# Patient Record
Sex: Female | Born: 1980 | Race: Black or African American | Hispanic: No | Marital: Married | Smoking: Never smoker
Health system: Southern US, Community
[De-identification: ages and names within clinical notes are randomized; demographics above are authoritative.]

---

## 2017-06-05 ENCOUNTER — Ambulatory Visit
Admission: RE | Admit: 2017-06-05 | Discharge: 2017-06-05 | Disposition: A | Discharge status: Home / Self Care | Payer: No Typology Code available for payment source | Source: Ambulatory Visit | Attending: Internal Medicine | Admitting: Internal Medicine

## 2017-06-05 ENCOUNTER — Other Ambulatory Visit: Payer: Self-pay | Admitting: Internal Medicine

## 2017-06-05 DIAGNOSIS — Z111 Encounter for screening for respiratory tuberculosis: Secondary | ICD-10-CM

## 2017-08-07 DIAGNOSIS — E786 Lipoprotein deficiency: Secondary | ICD-10-CM | POA: Insufficient documentation

## 2017-08-07 DIAGNOSIS — R718 Other abnormality of red blood cells: Secondary | ICD-10-CM | POA: Insufficient documentation

## 2017-08-07 DIAGNOSIS — D500 Iron deficiency anemia secondary to blood loss (chronic): Secondary | ICD-10-CM

## 2017-08-07 DIAGNOSIS — N920 Excessive and frequent menstruation with regular cycle: Secondary | ICD-10-CM | POA: Insufficient documentation

## 2017-08-07 DIAGNOSIS — R7303 Prediabetes: Secondary | ICD-10-CM | POA: Insufficient documentation

## 2017-08-07 HISTORY — DX: Other abnormality of red blood cells: R71.8

## 2017-08-07 HISTORY — DX: Iron deficiency anemia secondary to blood loss (chronic): D50.0

## 2018-02-27 ENCOUNTER — Ambulatory Visit: Payer: Self-pay | Admitting: Obstetrics

## 2018-02-28 ENCOUNTER — Encounter: Payer: Self-pay | Admitting: Obstetrics

## 2018-02-28 ENCOUNTER — Ambulatory Visit (INDEPENDENT_AMBULATORY_CARE_PROVIDER_SITE_OTHER): Payer: PRIVATE HEALTH INSURANCE | Admitting: Obstetrics

## 2018-02-28 VITALS — BP 115/81 | HR 89 | Ht 66.0 in | Wt 204.5 lb

## 2018-02-28 DIAGNOSIS — Z3202 Encounter for pregnancy test, result negative: Secondary | ICD-10-CM

## 2018-02-28 LAB — POCT URINE PREGNANCY: PREG TEST UR: NEGATIVE

## 2018-02-28 NOTE — Progress Notes (Signed)
Pt is here for initial visit. States that her periods are lasting longer than they used to.

## 2018-02-28 NOTE — Progress Notes (Signed)
Patient currently menstruating too heavy for pap smear.  She elected to reschedule her appointment.

## 2018-04-07 ENCOUNTER — Ambulatory Visit (INDEPENDENT_AMBULATORY_CARE_PROVIDER_SITE_OTHER): Payer: 59 | Admitting: Obstetrics and Gynecology

## 2018-04-07 ENCOUNTER — Encounter: Payer: Self-pay | Admitting: Obstetrics and Gynecology

## 2018-04-07 VITALS — BP 112/77 | HR 73 | Wt 204.2 lb

## 2018-04-07 DIAGNOSIS — Z01419 Encounter for gynecological examination (general) (routine) without abnormal findings: Secondary | ICD-10-CM | POA: Diagnosis not present

## 2018-04-07 DIAGNOSIS — Z1151 Encounter for screening for human papillomavirus (HPV): Secondary | ICD-10-CM | POA: Diagnosis not present

## 2018-04-07 DIAGNOSIS — Z113 Encounter for screening for infections with a predominantly sexual mode of transmission: Secondary | ICD-10-CM | POA: Diagnosis not present

## 2018-04-07 DIAGNOSIS — Z124 Encounter for screening for malignant neoplasm of cervix: Secondary | ICD-10-CM

## 2018-04-07 DIAGNOSIS — N898 Other specified noninflammatory disorders of vagina: Secondary | ICD-10-CM | POA: Diagnosis not present

## 2018-04-07 NOTE — Progress Notes (Signed)
Pt is here for pap smear/annual today.

## 2018-04-07 NOTE — Progress Notes (Signed)
Subjective:     Carene Gent is a 38 y.o. female P3 with BMI 32 abd LMP 03/22/2018 who is here for a comprehensive physical exam. The patient reports no problems. She is sexually active without contraception and is hoping for another child. Patient is without complaints. She denies pelvic pain or abnormal discharge. Patient desires STI screening  History reviewed. No pertinent past medical history. History reviewed. No pertinent surgical history. Family History  Problem Relation Age of Onset  . Diabetes Father     Social History   Socioeconomic History  . Marital status: Married    Spouse name: Not on file  . Number of children: Not on file  . Years of education: Not on file  . Highest education level: Not on file  Occupational History  . Not on file  Social Needs  . Financial resource strain: Not on file  . Food insecurity:    Worry: Not on file    Inability: Not on file  . Transportation needs:    Medical: Not on file    Non-medical: Not on file  Tobacco Use  . Smoking status: Not on file  Substance and Sexual Activity  . Alcohol use: Not on file  . Drug use: Not on file  . Sexual activity: Yes  Lifestyle  . Physical activity:    Days per week: Not on file    Minutes per session: Not on file  . Stress: Not on file  Relationships  . Social connections:    Talks on phone: Not on file    Gets together: Not on file    Attends religious service: Not on file    Active member of club or organization: Not on file    Attends meetings of clubs or organizations: Not on file    Relationship status: Not on file  . Intimate partner violence:    Fear of current or ex partner: Not on file    Emotionally abused: Not on file    Physically abused: Not on file    Forced sexual activity: Not on file  Other Topics Concern  . Not on file  Social History Narrative  . Not on file   Health Maintenance  Topic Date Due  . HIV Screening  11/20/1993  . TETANUS/TDAP   11/20/1997  . PAP SMEAR-Modifier  11/21/1999  . INFLUENZA VACCINE  10/17/2017       Review of Systems Pertinent items are noted in HPI.   Objective:  Blood pressure 112/77, pulse 73, weight 204 lb 3.2 oz (92.6 kg), last menstrual period 03/22/2018.     GENERAL: Well-developed, well-nourished female in no acute distress.  HEENT: Normocephalic, atraumatic. Sclerae anicteric.  NECK: Supple. Normal thyroid.  LUNGS: Clear to auscultation bilaterally.  HEART: Regular rate and rhythm. BREASTS: Symmetric in size. No palpable masses or lymphadenopathy, skin changes, or nipple drainage. ABDOMEN: Soft, nontender, nondistended. No organomegaly. PELVIC: Normal external female genitalia. Vagina is pink and rugated.  Normal discharge. Normal appearing cervix. Uterus is normal in size. No adnexal mass or tenderness. EXTREMITIES: No cyanosis, clubbing, or edema, 2+ distal pulses.    Assessment:    Healthy female exam.      Plan:    pap smear collected Patient desires STI screening Screening mammogram at the age of 33 Health maintenance labs ordered See After Visit Summary for Counseling Recommendations

## 2018-04-08 LAB — COMPREHENSIVE METABOLIC PANEL
A/G RATIO: 1.3 (ref 1.2–2.2)
ALK PHOS: 91 IU/L (ref 39–117)
ALT: 13 IU/L (ref 0–32)
AST: 11 IU/L (ref 0–40)
Albumin: 4.2 g/dL (ref 3.8–4.8)
BUN/Creatinine Ratio: 14 (ref 9–23)
BUN: 10 mg/dL (ref 6–20)
Bilirubin Total: 0.3 mg/dL (ref 0.0–1.2)
CO2: 21 mmol/L (ref 20–29)
Calcium: 9.2 mg/dL (ref 8.7–10.2)
Chloride: 100 mmol/L (ref 96–106)
Creatinine, Ser: 0.71 mg/dL (ref 0.57–1.00)
GFR calc Af Amer: 124 mL/min/{1.73_m2} (ref >59)
GFR calc non Af Amer: 108 mL/min/{1.73_m2} (ref >59)
Globulin, Total: 3.3 g/dL (ref 1.5–4.5)
Glucose: 87 mg/dL (ref 65–99)
POTASSIUM: 4.5 mmol/L (ref 3.5–5.2)
Sodium: 138 mmol/L (ref 134–144)
Total Protein: 7.5 g/dL (ref 6.0–8.5)

## 2018-04-08 LAB — CBC
Hematocrit: 38.4 % (ref 34.0–46.6)
Hemoglobin: 12.2 g/dL (ref 11.1–15.9)
MCH: 24.8 pg — ABNORMAL LOW (ref 26.6–33.0)
MCHC: 31.8 g/dL (ref 31.5–35.7)
MCV: 78 fL — AB (ref 79–97)
Platelets: 280 10*3/uL (ref 150–450)
RBC: 4.92 x10E6/uL (ref 3.77–5.28)
RDW: 14.3 % (ref 11.7–15.4)
WBC: 5.3 10*3/uL (ref 3.4–10.8)

## 2018-04-08 LAB — LIPID PANEL
Chol/HDL Ratio: 4.4 ratio (ref 0.0–4.4)
Cholesterol, Total: 173 mg/dL (ref 100–199)
HDL: 39 mg/dL — ABNORMAL LOW (ref >39)
LDL Calculated: 123 mg/dL — ABNORMAL HIGH (ref 0–99)
Triglycerides: 54 mg/dL (ref 0–149)
VLDL Cholesterol Cal: 11 mg/dL (ref 5–40)

## 2018-04-08 LAB — CERVICOVAGINAL ANCILLARY ONLY
Bacterial vaginitis: NEGATIVE
CHLAMYDIA, DNA PROBE: NEGATIVE
Candida vaginitis: NEGATIVE
NEISSERIA GONORRHEA: NEGATIVE
Trichomonas: POSITIVE — AB

## 2018-04-08 LAB — HIV ANTIBODY (ROUTINE TESTING W REFLEX): HIV Screen 4th Generation wRfx: NONREACTIVE

## 2018-04-08 LAB — HEMOGLOBIN A1C
Est. average glucose Bld gHb Est-mCnc: 120 mg/dL
HEMOGLOBIN A1C: 5.8 % — AB (ref 4.8–5.6)

## 2018-04-08 LAB — HEPATITIS B SURFACE ANTIGEN: HEP B S AG: NEGATIVE

## 2018-04-08 LAB — TSH: TSH: 1.42 u[IU]/mL (ref 0.450–4.500)

## 2018-04-08 LAB — RPR: RPR Ser Ql: NONREACTIVE

## 2018-04-08 LAB — HEPATITIS C ANTIBODY: Hep C Virus Ab: <0.1 s/co ratio (ref 0.0–0.9)

## 2018-04-09 MED ORDER — METRONIDAZOLE 500 MG PO TABS: 500.0000 mg | ORAL_TABLET | Freq: Two times a day (BID) | ORAL | 0 refills | Status: DC

## 2018-04-09 NOTE — Addendum Note (Signed)
Addended by: Catalina Antigua on: 04/09/2018 03:06 PM   Modules accepted: Orders

## 2018-04-10 LAB — CYTOLOGY - PAP
DIAGNOSIS: NEGATIVE
HPV (WINDOPATH): NOT DETECTED

## 2018-04-16 ENCOUNTER — Telehealth: Payer: Self-pay

## 2018-04-16 NOTE — Telephone Encounter (Signed)
Attempted to contact about results, no answer, left vm 

## 2018-04-17 ENCOUNTER — Telehealth: Payer: Self-pay

## 2018-04-17 NOTE — Telephone Encounter (Signed)
S/w pt via french interpreter, advised of results and rx sent.

## 2018-04-17 NOTE — Telephone Encounter (Signed)
Contacted pt with french interpreter for results, left vm

## 2020-06-30 ENCOUNTER — Emergency Department (HOSPITAL_COMMUNITY)
Admission: EM | Admit: 2020-06-30 | Discharge: 2020-07-01 | Disposition: A | Discharge status: Home / Self Care | Payer: BC Managed Care – PPO | Attending: Emergency Medicine | Admitting: Emergency Medicine

## 2020-06-30 ENCOUNTER — Emergency Department (HOSPITAL_COMMUNITY): Payer: BC Managed Care – PPO

## 2020-06-30 ENCOUNTER — Encounter (HOSPITAL_COMMUNITY): Payer: Self-pay | Admitting: Emergency Medicine

## 2020-06-30 ENCOUNTER — Other Ambulatory Visit: Payer: Self-pay

## 2020-06-30 DIAGNOSIS — R042 Hemoptysis: Secondary | ICD-10-CM | POA: Insufficient documentation

## 2020-06-30 NOTE — ED Triage Notes (Signed)
Emergency Medicine Provider Triage Evaluation Note  Amber Alvarado , a 40 y.o. female  was evaluated in triage.  Pt complains of blood-tinged sputum x3 over the last week.  Review of Systems  Positive: As above Negative: Pain, shortness of breath, fevers, chills, cough, nasal congestion, recent travel, leg swelling  Physical Exam  BP (!) 143/101 (BP Location: Left Arm)   Pulse 89   Temp 99.7 F (37.6 C) (Oral)   Resp 16   SpO2 100%  Gen:   Awake, no distress   HEENT:  Atraumatic  Resp:  Normal effort  Cardiac:  Normal rate  Abd:   Nondistended, nontender  MSK:   Moves extremities without difficulty  Neuro:  Speech clear   Medical Decision Making  Medically screening exam initiated at 6:26 PM.  Appropriate orders placed.  Amber Alvarado was informed that the remainder of the evaluation will be completed by another provider, this initial triage assessment does not replace that evaluation, and the importance of remaining in the ED until their evaluation is complete.  Clinical Impression  Patient with 3 separate episodes of blood-tinged sputum.  No crusting, no infectious symptoms.  No chest pain, shortness of breath.  Plan for chest x-ray.  Patient stable for further evaluation   Mare Ferrari, PA-C 06/30/20 1847

## 2020-06-30 NOTE — ED Triage Notes (Signed)
Pt reports "spitting up" mucous with blood in it x 3. Denies cough, vomiting, chest pain, shortness of breath. States it always clears up.

## 2020-06-30 NOTE — ED Provider Notes (Signed)
[facility] EMERGENCY DEPARTMENT Provider Note   CSN: [id number] Arrival date & time: 06/30/20  1820     History Chief Complaint  Patient presents with  . blood in mucous    Amber Alvarado is a 40 y.o. female.  Level 5 caveat for language barrier.  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  Patient here stating she has "blood in my saliva and mucus".  Has noticed specks of blood in her saliva when she clears her throat x3 in the past 2 weeks.  Denies coughing up any blood.  Denies any chest pain or shortness of breath.  Denies any difficulty breathing or difficulty swallowing.  States this happens occasionally and usually resolves after a few seconds.  Occasionally she sees blood when she "brushes my teeth too hard".  Does not take any blood thinners.  Denies any productive cough.  Denies abdominal pain, nausea or vomiting.  Denies any black or bloody stools.  Denies any recent travel out of the country.  Denies any sick contacts.  Born in [place] but has been in [place] for more than 7 years. No dizziness or lightheadedness.  The history is provided by the patient.       History reviewed. No pertinent past medical history.  There are no problems to display for this patient.   History reviewed. No pertinent surgical history.   OB History    Gravida  3   Para  3   Term  3   Preterm      AB      Living  3     SAB      IAB      Ectopic      Multiple      Live Births  3           Family History  Problem Relation Age of Onset  . Diabetes Father        Home Medications Prior to Admission medications   Medication Sig Start Date End Date Taking? Authorizing Provider  metroNIDAZOLE (FLAGYL) 500 MG tablet Take 1 tablet (500 mg total) by mouth 2 (two) times daily. 04/09/18   Constant, Peggy, MD    Allergies    Patient has no known allergies.  Review of Systems   Review of Systems  Constitutional: Negative for activity change, appetite change and  fever.  HENT: Negative for congestion and mouth sores.   Eyes: Negative for visual disturbance.  Respiratory: Negative for cough, chest tightness and shortness of breath.   Gastrointestinal: Negative for abdominal pain, nausea and vomiting.  Genitourinary: Negative for dysuria and hematuria.  Musculoskeletal: Negative for arthralgias and myalgias.  Skin: Negative for rash.  Neurological: Negative for dizziness, weakness and headaches.   all other systems are negative except as noted in the HPI and PMH.   Physical Exam Updated Vital Signs BP (!) 131/96   Pulse 67   Temp 97.9 F (36.6 C)   Resp 17   LMP 06/06/2020   SpO2 100%   Physical Exam Vitals and nursing note reviewed.  Constitutional:      General: She is not in acute distress.    Appearance: Normal appearance. She is well-developed and normal weight. She is not ill-appearing.  HENT:     Head: Normocephalic and atraumatic.     Mouth/Throat:     Pharynx: No oropharyngeal exudate.     Comments: Tongue, teeth and gingiva appear normal without evidence of recent bleeding.  No blood in oropharynx Eyes:  Conjunctiva/sclera: Conjunctivae normal.     Pupils: Pupils are equal, round, and reactive to light.  Neck:     Comments: No meningismus. Cardiovascular:     Rate and Rhythm: Normal rate and regular rhythm.     Heart sounds: Normal heart sounds. No murmur heard.   Pulmonary:     Effort: Pulmonary effort is normal. No respiratory distress.     Breath sounds: Normal breath sounds.  Chest:     Chest wall: No tenderness.  Abdominal:     Palpations: Abdomen is soft.     Tenderness: There is no abdominal tenderness. There is no guarding or rebound.  Musculoskeletal:        General: No tenderness. Normal range of motion.     Cervical back: Normal range of motion and neck supple.  Skin:    General: Skin is warm.  Neurological:     Mental Status: She is alert and oriented to person, place, and time.     Cranial  Nerves: No cranial nerve deficit.     Motor: No abnormal muscle tone.     Coordination: Coordination normal.     Comments:  5/5 strength throughout. CN 2-12 intact.Equal grip strength.   Psychiatric:        Behavior: Behavior normal.     ED Results / Procedures / Treatments   Labs (all labs ordered are listed, but only abnormal results are displayed) Labs Reviewed  CBC WITH DIFFERENTIAL/PLATELET - Abnormal; Notable for the following components:      Result Value   Hemoglobin 10.5 (*)    HCT 32.7 (*)    MCV 73.6 (*)    MCH 23.6 (*)    RDW 15.7 (*)    All other components within normal limits  COMPREHENSIVE METABOLIC PANEL - Abnormal; Notable for the following components:   Calcium 8.8 (*)    Anion gap 3 (*)    All other components within normal limits  D-DIMER, QUANTITATIVE - Abnormal; Notable for the following components:   D-Dimer, Quant 0.89 (*)    All other components within normal limits  I-STAT BETA HCG BLOOD, ED ([facility], [facility], [facility] ONLY)  POC URINE PREG, ED    EKG None  Radiology DG Chest 2 View  Result Date: 06/30/2020 CLINICAL DATA:  Three-day history of bloody sputum/hemoptysis. EXAM: CHEST - 2 VIEW COMPARISON:  06/05/2017. FINDINGS: Cardiomediastinal silhouette unremarkable and unchanged. Lungs clear. Bronchovascular markings normal. Pulmonary vascularity normal. No visible pleural effusions. No pneumothorax. Stable slight thoracic dextroscoliosis. Visualized bony thorax otherwise unremarkable. IMPRESSION: No acute cardiopulmonary disease.  Stable examination. Electronically Signed   By: Hulan Saas M.D.   On: 06/30/2020 19:26   CT Angio Chest PE W and/or Wo Contrast  Result Date: 07/01/2020 CLINICAL DATA:  40 year old female with 3 day history of bloody sputum. EXAM: CT ANGIOGRAPHY CHEST WITH CONTRAST TECHNIQUE: Multidetector CT imaging of the chest was performed using the standard protocol during bolus administration of intravenous contrast. Multiplanar CT image  reconstructions and MIPs were obtained to evaluate the vascular anatomy. CONTRAST:  78mL OMNIPAQUE IOHEXOL 350 MG/ML SOLN COMPARISON:  Chest radiographs yesterday. FINDINGS: Cardiovascular: Good contrast bolus timing in the pulmonary arterial tree. No focal filling defect identified in the pulmonary arteries to suggest acute pulmonary embolism. Cardiac size within normal limits. No pericardial effusion. Normal visible aorta. No coronary artery calcified atherosclerosis is evident. Mediastinum/Nodes: Negative. No mediastinal or hilar lymphadenopathy. Lungs/Pleura: Somewhat low lung volumes. Major airways remain patent. There is left upper lobe bronchiectasis with  distal airway opacification and segmental upper lobe collapse (series 11, image 30). Superimposed mosaic attenuation elsewhere in the left lung likely due to additional small airway disease and gas trapping. No pleural effusion. No convincing bronchiectasis elsewhere. Negative right lung aside from mild atelectasis. Upper Abdomen: Negative visible liver, spleen, pancreas, adrenal glands, bowel and right kidney. Questionable exophytic left renal upper pole soft tissue mass versus intermediate density cyst on series 5, image 95. Visible portion is about 17 mm diameter. Musculoskeletal: Negative. Review of the MIP images confirms the above findings. IMPRESSION: 1. Negative for acute pulmonary embolus. 2. Positive for segmental Bronchiectasis in the medial left upper lobe with distal airway opacification and segmental lung collapse. Legrand Rams this as the etiology of bloody sputum. Additional lobar gas trapping. No pleural effusion or other pulmonary abnormality. 3. Indeterminate appearance of the visible left renal upper pole. Cannot exclude exophytic soft tissue mass, although hyperdense benign cyst is possible. This would best be characterized with a routine outpatient Abdomen MRI (renal mass protocol without and with contrast). Electronically Signed   By: Odessa Fleming  M.D.   On: 07/01/2020 05:11    Procedures Procedures   Medications Ordered in ED Medications - No data to display  ED Course  I have reviewed the triage vital signs and the nursing notes.  Pertinent labs & imaging results that were available during my care of the patient were reviewed by me and considered in my medical decision making (see chart for details).    MDM Rules/Calculators/A&P                          Patient here with intermittent spots of blood in her saliva and mucus.   Chest x-ray in triage shows no acute abnormalities. Screening labs will be obtained to evaluate for thrombocytopenia.  Hemoglobin slightly decreased at 10.5 from 12 2 years ago.  Platelet count is stable.  D-dimer is positive.  CT scan shows no pulmonary embolism but does show bronchiectasis in the left upper lobe with distal airway opacification.  Patient informed of left renal mass and need for MRI follow-up.  D/w Dr. Aldean Ast.  Patient is stable.  She can follow-up in the office.  He does agree with Levaquin 750 mg x 5 days.  Return to the ED if symptoms worsen. Follow up with PCP and pulmonary. Patient aware of need for MRI of renal finding.  No hypoxia or increased work of breathing. No witnessed hemoptysis in the ED.  Return precautions discussed.  Final Clinical Impression(s) / ED Diagnoses Final diagnoses:  Hemoptysis    Rx / DC Orders ED Discharge Orders    None       Brylen Wagar, Jeannett Senior, MD 07/01/20 [phone]

## 2020-07-01 ENCOUNTER — Emergency Department (HOSPITAL_COMMUNITY): Payer: BC Managed Care – PPO

## 2020-07-01 LAB — COMPREHENSIVE METABOLIC PANEL
ALT: 14 U/L (ref 0–44)
AST: 15 U/L (ref 15–41)
Albumin: 3.6 g/dL (ref 3.5–5.0)
Alkaline Phosphatase: 59 U/L (ref 38–126)
Anion gap: 3 — ABNORMAL LOW (ref 5–15)
BUN: 9 mg/dL (ref 6–20)
CO2: 27 mmol/L (ref 22–32)
Calcium: 8.8 mg/dL — ABNORMAL LOW (ref 8.9–10.3)
Chloride: 105 mmol/L (ref 98–111)
Creatinine, Ser: 0.7 mg/dL (ref 0.44–1.00)
GFR, Estimated: >60 mL/min (ref >60)
Glucose, Bld: 90 mg/dL (ref 70–99)
Potassium: 3.8 mmol/L (ref 3.5–5.1)
Sodium: 135 mmol/L (ref 135–145)
Total Bilirubin: 0.7 mg/dL (ref 0.3–1.2)
Total Protein: 7.7 g/dL (ref 6.5–8.1)

## 2020-07-01 LAB — CBC WITH DIFFERENTIAL/PLATELET
Abs Immature Granulocytes: 0.02 10*3/uL (ref 0.00–0.07)
Basophils Absolute: 0 10*3/uL (ref 0.0–0.1)
Basophils Relative: 1 %
Eosinophils Absolute: 0.1 10*3/uL (ref 0.0–0.5)
Eosinophils Relative: 2 %
HCT: 32.7 % — ABNORMAL LOW (ref 36.0–46.0)
Hemoglobin: 10.5 g/dL — ABNORMAL LOW (ref 12.0–15.0)
Immature Granulocytes: 0 %
Lymphocytes Relative: 42 %
Lymphs Abs: 2.7 10*3/uL (ref 0.7–4.0)
MCH: 23.6 pg — ABNORMAL LOW (ref 26.0–34.0)
MCHC: 32.1 g/dL (ref 30.0–36.0)
MCV: 73.6 fL — ABNORMAL LOW (ref 80.0–100.0)
Monocytes Absolute: 0.3 10*3/uL (ref 0.1–1.0)
Monocytes Relative: 5 %
Neutro Abs: 3.2 10*3/uL (ref 1.7–7.7)
Neutrophils Relative %: 50 %
Platelets: 232 10*3/uL (ref 150–400)
RBC: 4.44 MIL/uL (ref 3.87–5.11)
RDW: 15.7 % — ABNORMAL HIGH (ref 11.5–15.5)
WBC: 6.3 10*3/uL (ref 4.0–10.5)
nRBC: 0 % (ref 0.0–0.2)

## 2020-07-01 LAB — I-STAT BETA HCG BLOOD, ED (MC, WL, AP ONLY): I-stat hCG, quantitative: <5 m[IU]/mL (ref <5)

## 2020-07-01 LAB — D-DIMER, QUANTITATIVE: D-Dimer, Quant: 0.89 ug/mL-FEU — ABNORMAL HIGH (ref 0.00–0.50)

## 2020-07-01 MED ORDER — IOHEXOL 350 MG/ML SOLN
80.0000 mL | Freq: Once | INTRAVENOUS | Status: AC | PRN
  Administered 2020-07-01: 59 mL via INTRAVENOUS

## 2020-07-01 MED ORDER — LEVOFLOXACIN 750 MG PO TABS: 750.0000 mg | ORAL_TABLET | Freq: Every day | ORAL | 0 refills | Status: DC

## 2020-07-01 NOTE — Discharge Instructions (Addendum)
Take the antibiotics as prescribed and follow-up with the pulmonologist. You also need to have an MRI of your abdomen to further assess a possible mass in your kidney.  Return to the ED with difficulty breathing, chest pain, coughing up more blood or any other concerns

## 2020-07-26 ENCOUNTER — Ambulatory Visit: Payer: BC Managed Care – PPO | Admitting: Pulmonary Disease

## 2020-07-26 ENCOUNTER — Encounter: Payer: Self-pay | Admitting: Pulmonary Disease

## 2020-07-26 ENCOUNTER — Other Ambulatory Visit: Payer: Self-pay

## 2020-07-26 VITALS — BP 120/74 | HR 76 | Temp 98.0°F | Ht 66.0 in | Wt 199.8 lb

## 2020-07-26 DIAGNOSIS — J479 Bronchiectasis, uncomplicated: Secondary | ICD-10-CM | POA: Diagnosis not present

## 2020-07-26 NOTE — Patient Instructions (Addendum)
You have an abnormal airway in the left anterior part of your lung called bronchiectasis.   There is no other treatment needed at this time.   We will have you follow up in 6 months after you have a repeat CT chest scan done to monitor the abnormal airway

## 2020-07-26 NOTE — Progress Notes (Signed)
Synopsis: Referred in May 2022 for hemoptysis  Subjective:   PATIENT ID: Amber Alvarado GENDER: female DOB: [birth year], MRN: [id number]   HPI  Chief Complaint  Patient presents with   Hospitalization Follow-up    HFU. States she has been doing well since being home. Denies seeing any more blood since being home.    Amber Alvarado is a 40 year old woman, never smoker who is referred to pulmonary clinic for evaluation of hemoptysis.   She presented to the ER on 06/30/20 with blood in her sputum. She initially noted specks of blood over the previous week or so. She denied any issues with her breathing or chest pain. She denied any fever, chills or sweats. She had elevated d-dimer so CTA chest was done and negative for PE but did show bronchiectatic airway of the left upper lobe with distal impaction. She was treated with levaquin for 5 days and denies any further episodes of blood in her sputum. She denies any wheezing.   She denies any TB exposures in the past. She denies any history of respiratory issues or infections. She denies dysphagia or GERD symptoms. She has lost weight unintentional recently, about 7lbs.   No past medical history on file.   Family History  Problem Relation Age of Onset   Diabetes Father      Social History   Socioeconomic History   Marital status: Married    Spouse name: Not on file   Number of children: Not on file   Years of education: Not on file   Highest education level: Not on file  Occupational History   Not on file  Tobacco Use   Smoking status: Never   Smokeless tobacco: Never  Substance and Sexual Activity   Alcohol use: Not on file   Drug use: Not on file   Sexual activity: Yes  Other Topics Concern   Not on file  Social History Narrative   Not on file   Social Determinants of Health   Financial Resource Strain: Not on file  Food Insecurity: Not on file  Transportation Needs: Not on file  Physical Activity:  Not on file  Stress: Not on file  Social Connections: Not on file  Intimate Partner Violence: Not on file     No Known Allergies   Outpatient Medications Prior to Visit  Medication Sig Dispense Refill   levofloxacin (LEVAQUIN) 750 MG tablet Take 1 tablet (750 mg total) by mouth daily. 5 tablet 0   No facility-administered medications prior to visit.    Review of Systems  Constitutional:  Negative for chills, fever, malaise/fatigue and weight loss.  HENT:  Negative for congestion, sinus pain and sore throat.   Eyes: Negative.   Respiratory:  Negative for cough, hemoptysis, sputum production, shortness of breath and wheezing.   Cardiovascular:  Negative for chest pain, palpitations, orthopnea, claudication and leg swelling.  Gastrointestinal:  Negative for abdominal pain, heartburn, nausea and vomiting.  Genitourinary: Negative.   Musculoskeletal:  Negative for joint pain and myalgias.  Skin:  Negative for rash.  Neurological:  Negative for weakness.  Endo/Heme/Allergies: Negative.   Psychiatric/Behavioral: Negative.       Objective:   Vitals:   07/26/20 1532  BP: 120/74  Pulse: 76  Temp: 98 F (36.7 C)  TempSrc: Temporal  SpO2: 100%  Weight: 199 lb 12.8 oz (90.6 kg)  Height: 5\' 6"  (1.676 m)     Physical Exam Constitutional:      General: She  is not in acute distress.    Appearance: She is not ill-appearing.  HENT:     Head: Normocephalic and atraumatic.  Eyes:     General: No scleral icterus.    Conjunctiva/sclera: Conjunctivae normal.     Pupils: Pupils are equal, round, and reactive to light.  Cardiovascular:     Rate and Rhythm: Normal rate and regular rhythm.     Pulses: Normal pulses.     Heart sounds: Normal heart sounds. No murmur heard. Pulmonary:     Effort: Pulmonary effort is normal.     Breath sounds: Normal breath sounds. No wheezing, rhonchi or rales.  Abdominal:     General: Bowel sounds are normal.     Palpations: Abdomen is soft.   Musculoskeletal:     Right lower leg: No edema.     Left lower leg: No edema.  Lymphadenopathy:     Cervical: No cervical adenopathy.  Skin:    General: Skin is warm and dry.  Neurological:     General: No focal deficit present.     Mental Status: She is alert.  Psychiatric:        Mood and Affect: Mood normal.        Behavior: Behavior normal.        Thought Content: Thought content normal.        Judgment: Judgment normal.      CBC    Component Value Date/Time   WBC 6.3 07/01/2020 0133   RBC 4.44 07/01/2020 0133   HGB 10.5 (L) 07/01/2020 0133   HGB 12.2 04/07/2018 1117   HCT 32.7 (L) 07/01/2020 0133   HCT 38.4 04/07/2018 1117   PLT 232 07/01/2020 0133   PLT 280 04/07/2018 1117   MCV 73.6 (L) 07/01/2020 0133   MCV 78 (L) 04/07/2018 1117   MCH 23.6 (L) 07/01/2020 0133   MCHC 32.1 07/01/2020 0133   RDW 15.7 (H) 07/01/2020 0133   RDW 14.3 04/07/2018 1117   LYMPHSABS 2.7 07/01/2020 0133   MONOABS 0.3 07/01/2020 0133   EOSABS 0.1 07/01/2020 0133   BASOSABS 0.0 07/01/2020 0133   BMP Latest Ref Rng & Units 08/17/2020 07/01/2020 04/07/2018  Glucose 65 - 99 mg/dL 86 90 87  BUN 6 - 24 mg/dL 10 9 10   Creatinine 0.57 - 1.00 mg/dL 1.54 0.08  BUN/Creat Ratio 9 - 23 14 - 14  Sodium 134 - 144 mmol/L 139 135 138  Potassium 3.5 - 5.2 mmol/L 4.8 3.8 4.5  Chloride 96 - 106 mmol/L 105 105 100  CO2 20 - 29 mmol/L 23 27 21   Calcium 8.7 - 10.2 mg/dL 9.3 6.76) 9.2    Chest imaging: CTA Chest 07/01/20 No pulmonary emboli. Segmental bronchiectasis in the medial left upper lobe with distal airway opacification and segmental lung collapse. Mosaic attenuation elsewhere in left lung concerning for small airways disease.  PFT: No flowsheet data found.    Assessment & Plan:   Bronchiectasis without complication (HCC) - Plan: CT Chest Wo Contrast  Discussion: Amber Alvarado is a 40 year old woman, never smoker who is referred to pulmonary clinic for evaluation of  hemoptysis.   She has a bronchiectatic airway of the left upper lobe which was likely infected that led to inflammation and bleeding with the resultant hemoptysis. She clinically improved with levaquin treatment.   We will repeat a CT chest in 6 months to monitor this airway.   Lyndee Hensen, MD [facility] Office: [phone]  Current Outpatient Medications:    Iron, Ferrous Sulfate, 325 (65 Fe) MG TABS, Take 325 mg by mouth daily., Disp: 100 tablet, Rfl: 1

## 2020-08-17 ENCOUNTER — Ambulatory Visit: Payer: BC Managed Care – PPO | Attending: Physician Assistant | Admitting: Physician Assistant

## 2020-08-17 ENCOUNTER — Encounter: Payer: Self-pay | Admitting: Physician Assistant

## 2020-08-17 ENCOUNTER — Encounter (INDEPENDENT_AMBULATORY_CARE_PROVIDER_SITE_OTHER): Payer: Self-pay

## 2020-08-17 ENCOUNTER — Other Ambulatory Visit: Payer: Self-pay

## 2020-08-17 VITALS — BP 108/70 | HR 86 | Resp 16 | Ht 65.5 in | Wt 198.6 lb

## 2020-08-17 DIAGNOSIS — N2889 Other specified disorders of kidney and ureter: Secondary | ICD-10-CM | POA: Diagnosis not present

## 2020-08-17 DIAGNOSIS — Z09 Encounter for follow-up examination after completed treatment for conditions other than malignant neoplasm: Secondary | ICD-10-CM

## 2020-08-17 DIAGNOSIS — D500 Iron deficiency anemia secondary to blood loss (chronic): Secondary | ICD-10-CM

## 2020-08-17 DIAGNOSIS — Z789 Other specified health status: Secondary | ICD-10-CM | POA: Diagnosis not present

## 2020-08-17 MED ORDER — IRON (FERROUS SULFATE) 325 (65 FE) MG PO TABS: 325.0000 mg | ORAL_TABLET | Freq: Every day | ORAL | 1 refills | Status: DC

## 2020-08-17 NOTE — Progress Notes (Signed)
Patient ID: Amber Alvarado, female   DOB: Jan 14, 1981, 40 y.o.   MRN: [id number]     Amber Alvarado, is a 40 y.o. female  HKV:[id number]  FIE:[id number]  DOB - 1980-04-30  Subjective:  Chief Complaint and HPI: Amber Alvarado is a 40 y.o. female here today to establish care and for a follow up visit After ED visit 06/30/2020 for blood in her saliva.  This has resolved completely.  No SOB.  No cough.  No CP.    Incidentally, she was found to have a renal mass and needs follow up MRI.  No urinary issues.  No N/V/D.  Appetite is good.    H/o heavy periods and anemia.  Not on iron.  Hgb =10.5 at ED.    From ED note:  Patient here with intermittent spots of blood in her saliva and mucus.   Chest x-ray in triage shows no acute abnormalities. Screening labs will be obtained to evaluate for thrombocytopenia.  Hemoglobin slightly decreased at 10.5 from 12 2 years ago.  Platelet count is stable.  D-dimer is positive.  CT scan shows no pulmonary embolism but does show bronchiectasis in the left upper lobe with distal airway opacification.  Patient informed of left renal mass and need for MRI follow-up.  D/w Dr. Aldean Ast.  Patient is stable.  She can follow-up in the office.  He does agree with Levaquin 750 mg x 5 days.  Return to the ED if symptoms worsen. Follow up with PCP and pulmonary. Patient aware of need for MRI of renal finding.  No hypoxia or increased work of breathing. No witnessed hemoptysis in the ED.  Return precautions discussed.   CT chest IMPRESSION: 1. Negative for acute pulmonary embolus. 2. Positive for segmental Bronchiectasis in the medial left upper lobe with distal airway opacification and segmental lung collapse. Legrand Rams this as the etiology of bloody sputum. Additional lobar gas trapping. No pleural effusion or other pulmonary abnormality. 3. Indeterminate appearance of the visible left renal upper pole. Cannot exclude exophytic soft  tissue mass, although hyperdense benign cyst is possible. This would best be characterized with a routine outpatient Abdomen MRI (renal mass protocol without and with contrast).   ED/Hospital notes reviewed.   Social History:  From [place] but in [place] x 7 years  ROS:   Constitutional:  No f/c, No night sweats, No unexplained weight loss. EENT:  No vision changes, No blurry vision, No hearing changes. No mouth, throat, or ear problems.  Respiratory: No cough, No SOB Cardiac: No CP, no palpitations GI:  No abd pain, No N/V/D. GU: No Urinary s/sx Musculoskeletal: No joint pain Neuro: No headache, no dizziness, no motor weakness.  Skin: No rash Endocrine:  No polydipsia. No polyuria.  Psych: Denies SI/HI  Problem  Iron Deficiency Anemia Due to Chronic Blood Loss  Prediabetes    ALLERGIES: No Known Allergies  PAST MEDICAL HISTORY: No past medical history on file.  MEDICATIONS AT HOME: Prior to Admission medications   Medication Sig Start Date End Date Taking? Authorizing Provider  Iron, Ferrous Sulfate, 325 (65 Fe) MG TABS Take 325 mg by mouth daily. 08/17/20  Yes Anders Simmonds, PA-C     Objective:  EXAM:   Vitals:   08/17/20 1033  BP: 108/70  Pulse: 86  Resp: 16  SpO2: 99%  Weight: 198 lb 9.6 oz (90.1 kg)  Height: 5' 5.5" (1.664 m)    General appearance : A&OX3. NAD. Non-toxic-appearing HEENT: Atraumatic and Normocephalic.  PERRLA. EOM intact.  Neck: supple, no JVD. No cervical lymphadenopathy. No thyromegaly Chest/Lungs:  Breathing-non-labored, Good air entry bilaterally, breath sounds normal without rales, rhonchi, or wheezing  CVS: S1 S2 regular, no murmurs, gallops, rubs  Extremities: Bilateral Lower Ext shows no edema, both legs are warm to touch with = pulse throughout Neurology:  CN II-XII grossly intact, Non focal.   Psych:  TP linear. J/I WNL. Normal speech. Appropriate eye contact and affect.  Skin:  No Rash  Data Review Lab Results  Component  Value Date   HGBA1C 5.8 (H) 04/07/2018     Assessment & Plan   1. Iron deficiency anemia due to chronic blood loss Start iron sulfate 325 mg daily  2. Renal mass - MR Abdomen W Wo Contrast; Future - Basic metabolic panel  3. Other specified disorders of kidney and ureter - MR Abdomen W Wo Contrast; Future - Basic metabolic panel  4. Language barrier [facility] [place]) interpreters used and additional time performing visit was required.   5. Encounter for examination following treatment at hospital Cough and blood in sputum resolved.  Saw pulmonary 07/26/2020-incomplete note in Epic as of today's date  Doing well     Patient have been counseled extensively about nutrition and exercise  Return in about 3 months (around 11/17/2020) for assign PCP.  The patient was given clear instructions to go to ER or return to medical center if symptoms don't improve, worsen or new problems develop. The patient verbalized understanding. The patient was told to call to get lab results if they haven't heard anything in the next week.     Georgian Co, PA-C [facility] [place], [place] [phone]   08/17/2020, 11:11 AM

## 2020-08-18 LAB — BASIC METABOLIC PANEL
BUN/Creatinine Ratio: 14 (ref 9–23)
BUN: 10 mg/dL (ref 6–24)
CO2: 23 mmol/L (ref 20–29)
Calcium: 9.3 mg/dL (ref 8.7–10.2)
Chloride: 105 mmol/L (ref 96–106)
Creatinine, Ser: 0.71 mg/dL (ref 0.57–1.00)
Glucose: 86 mg/dL (ref 65–99)
Potassium: 4.8 mmol/L (ref 3.5–5.2)
Sodium: 139 mmol/L (ref 134–144)
eGFR: 109 mL/min/{1.73_m2} (ref >59)

## 2020-08-22 ENCOUNTER — Ambulatory Visit (HOSPITAL_BASED_OUTPATIENT_CLINIC_OR_DEPARTMENT_OTHER): Payer: BC Managed Care – PPO

## 2020-08-26 ENCOUNTER — Encounter: Payer: Self-pay | Admitting: Pulmonary Disease

## 2020-08-29 ENCOUNTER — Other Ambulatory Visit: Payer: Self-pay

## 2020-08-29 ENCOUNTER — Ambulatory Visit (HOSPITAL_COMMUNITY)
Admission: RE | Admit: 2020-08-29 | Discharge: 2020-08-29 | Disposition: A | Discharge status: Home / Self Care | Payer: BC Managed Care – PPO | Source: Ambulatory Visit | Attending: Physician Assistant | Admitting: Physician Assistant

## 2020-08-29 DIAGNOSIS — N2889 Other specified disorders of kidney and ureter: Secondary | ICD-10-CM | POA: Insufficient documentation

## 2020-08-29 MED ORDER — GADOBUTROL 1 MMOL/ML IV SOLN
9.0000 mL | Freq: Once | INTRAVENOUS | Status: AC | PRN
  Administered 2020-08-29: 9 mL via INTRAVENOUS

## 2020-08-31 ENCOUNTER — Other Ambulatory Visit: Payer: Self-pay | Admitting: Physician Assistant

## 2020-08-31 DIAGNOSIS — K8020 Calculus of gallbladder without cholecystitis without obstruction: Secondary | ICD-10-CM

## 2020-09-01 NOTE — Progress Notes (Signed)
Left message on voicemail per DPR. Encourage to call if they have additional questions or concerns.  Due to language barrier, an interpreter was used.  Interpreter name, Merian Capron Interpreter ID #[id number], Reason for encounter--lab results.

## 2020-11-21 NOTE — Progress Notes (Signed)
New Patient Office Visit  Subjective:  Patient ID: Amber Alvarado, female    DOB: [birth year]  Age: 40 y.o. MRN: [id number]  CC:  Chief Complaint  Patient presents with   Establish Care    HPI Amber Alvarado presents for establish primary care.  Patient was seen previously in June by PA [place].  Documentation is as below   Saw [name] 08/2020 Amber Alvarado is a 40 y.o. female here today to establish care and for a follow up visit After ED visit 06/30/2020 for blood in her saliva.  This has resolved completely.  No SOB.  No cough.  No CP.     Incidentally, she was found to have a renal mass and needs follow up MRI.  No urinary issues.  No N/V/D.  Appetite is good.     H/o heavy periods and anemia.  Not on iron.  Hgb =10.5 at ED.     From ED note:   Patient here with intermittent spots of blood in her saliva and mucus.    Chest x-ray in triage shows no acute abnormalities. Screening labs will be obtained to evaluate for thrombocytopenia.   Hemoglobin slightly decreased at 10.5 from 12 2 years ago.  Platelet count is stable.  D-dimer is positive.   CT scan shows no pulmonary embolism but does show bronchiectasis in the left upper lobe with distal airway opacification.  Patient informed of left renal mass and need for MRI follow-up.   D/w Dr. Aldean Ast.  Patient is stable.  She can follow-up in the office.  He does agree with Levaquin 750 mg x 5 days.  Return to the ED if symptoms worsen. Follow up with PCP and pulmonary. Patient aware of need for MRI of renal finding.  No hypoxia or increased work of breathing. No witnessed hemoptysis in the ED.  Return precautions discussed.     ED/Hospital notes reviewed.   Social History:  From [place] but in [place] x 7 years   1. Iron deficiency anemia due to chronic blood loss Start iron sulfate 325 mg daily   2. Renal mass - MR Abdomen W Wo Contrast; Future - Basic metabolic panel   3. Other specified  disorders of kidney and ureter - MR Abdomen W Wo Contrast; Future - Basic metabolic panel   4. Language barrier [facility] [place]) interpreters used and additional time performing visit was required.     5. Encounter for examination following treatment at hospital Cough and blood in sputum resolved.  Saw pulmonary 07/26/2020-incomplete note in Epic as of today's date  Note language barrier at today's visit is assisted by in person interpreter Joni Reining for [place] The patient's had no further hemoptysis since that visit.  She did see pulmonary and they detected no abnormalities she had finished her course of Levaquin and they recommended repeat CT scan of the chest in 6 months from the visit in May which would be in November of this year.  Note patient has had previous menorrhagia with normal cycle with iron deficiency document [place] by her gynecologist who is in [facility].  She is due another Pap smear in January and wishes to return to that gynecologist.  Also she had diagnostic mammograms done in February of this year in the [facility] imaging center.  She has calcifications in both breast they recommended repeat imaging in 6 months in August she is yet to have these imagings performed.  She is due a flu vaccine and she will  accept this.  The only medications he is currently taking his iron supplement.  She is about out of this medication and we will have her run out of this as we will assess her lab data at this visit.  She has no other complaints at this time.  She does work doing Lobbyistelectrical work.  She immigrated from [place] 7 years ago.  She had a tetanus shot before she immigrated.   History reviewed. No pertinent past medical history.  History reviewed. No pertinent surgical history.  Family History  Problem Relation Age of Onset   Diabetes Father     Social History   Socioeconomic History   Marital status: Married    Spouse name: Not on file   Number of children: Not on file    Years of education: Not on file   Highest education level: Not on file  Occupational History   Not on file  Tobacco Use   Smoking status: Never   Smokeless tobacco: Never  Substance and Sexual Activity   Alcohol use: Not on file   Drug use: Not on file   Sexual activity: Yes  Other Topics Concern   Not on file  Social History Narrative   Not on file   Social Determinants of Health   Financial Resource Strain: Not on file  Food Insecurity: Not on file  Transportation Needs: Not on file  Physical Activity: Not on file  Stress: Not on file  Social Connections: Not on file  Intimate Partner Violence: Not on file    ROS Review of Systems  Constitutional:  Negative for chills, diaphoresis and fever.  HENT:  Negative for congestion, ear discharge, ear pain, hearing loss, nosebleeds, sore throat and tinnitus.   Eyes:  Negative for photophobia and discharge.  Respiratory: Negative.  Negative for cough, shortness of breath, wheezing and stridor.        No excess mucus  Cardiovascular:  Negative for chest pain, palpitations and leg swelling.  Gastrointestinal: Negative.  Negative for abdominal pain, blood in stool, constipation, diarrhea, nausea and vomiting.  Endocrine: Negative for polydipsia.  Genitourinary:  Negative for dysuria, flank pain, frequency, hematuria and urgency.  Musculoskeletal:  Negative for back pain, myalgias and neck pain.  Skin:  Negative for rash.  Allergic/Immunologic: Negative for environmental allergies.  Neurological:  Negative for dizziness, tremors, seizures, weakness and headaches.  Hematological:  Does not bruise/bleed easily.  Psychiatric/Behavioral:  Negative for hallucinations and suicidal ideas. The patient is not nervous/anxious.   All other systems reviewed and are negative.  Objective:   Today's Vitals: BP 97/64   Pulse 86   Ht 5\' 6"  (1.676 m)   Wt 197 lb (89.4 kg)   SpO2 100%   BMI 31.80 kg/m   Physical Exam Vitals reviewed.   Constitutional:      Appearance: Normal appearance. She is well-developed. She is not diaphoretic.  HENT:     Head: Normocephalic and atraumatic.     Nose: No nasal deformity, septal deviation, mucosal edema or rhinorrhea.     Right Sinus: No maxillary sinus tenderness or frontal sinus tenderness.     Left Sinus: No maxillary sinus tenderness or frontal sinus tenderness.     Mouth/Throat:     Pharynx: No oropharyngeal exudate.  Eyes:     General: No scleral icterus.    Conjunctiva/sclera: Conjunctivae normal.     Pupils: Pupils are equal, round, and reactive to light.  Neck:     Thyroid: No thyromegaly.  Vascular: No carotid bruit or JVD.     Trachea: Trachea normal. No tracheal tenderness or tracheal deviation.  Cardiovascular:     Rate and Rhythm: Normal rate and regular rhythm.     Chest Wall: PMI is not displaced.     Pulses: Normal pulses. No decreased pulses.     Heart sounds: Normal heart sounds, S1 normal and S2 normal. Heart sounds not distant. No murmur heard. No systolic murmur is present.  No diastolic murmur is present.    No friction rub. No gallop. No S3 or S4 sounds.  Pulmonary:     Effort: No tachypnea, accessory muscle usage or respiratory distress.     Breath sounds: No stridor. No decreased breath sounds, wheezing, rhonchi or rales.  Chest:     Chest wall: No tenderness.  Abdominal:     General: Bowel sounds are normal. There is no distension.     Palpations: Abdomen is soft. Abdomen is not rigid.     Tenderness: There is no abdominal tenderness. There is no guarding or rebound.  Musculoskeletal:        General: Normal range of motion.     Cervical back: Normal range of motion and neck supple. No edema, erythema or rigidity. No muscular tenderness. Normal range of motion.  Lymphadenopathy:     Head:     Right side of head: No submental or submandibular adenopathy.     Left side of head: No submental or submandibular adenopathy.     Cervical: No  cervical adenopathy.  Skin:    General: Skin is warm and dry.     Coloration: Skin is not pale.     Findings: No rash.     Nails: There is no clubbing.  Neurological:     Mental Status: She is alert and oriented to person, place, and time.     Sensory: No sensory deficit.  Psychiatric:        Speech: Speech normal.        Behavior: Behavior normal.    06/30/20  CT chest  IMPRESSION: 1. Negative for acute pulmonary embolus. 2. Positive for segmental Bronchiectasis in the medial left upper lobe with distal airway opacification and segmental lung collapse. Legrand Rams this as the etiology of bloody sputum. Additional lobar gas trapping. No pleural effusion or other pulmonary abnormality. 3. Indeterminate appearance of the visible left renal upper pole. Cannot exclude exophytic soft tissue mass, although hyperdense benign cyst is possible. This would best be characterized with a routine outpatient Abdomen MRI (renal mass protocol without and with contrast).  Follow-up MRI June 2022 IMPRESSION: 1. Lobular contour of the left kidney without suspicious renal lesion visualized. 2. Benign Bosniak classification 1 right renal cyst. 3. Cholelithiasis without evidence of acute cholecystitis.   Assessment & Plan:   Problem List Items Addressed This Visit       Respiratory   Bronchiectasis (HCC)    History of bronchiectasis involving medial left upper lobe with distal airway opacification segmental lobe collapse this is likely the source of hemoptysis.  There is lobar gas trapping.  This has resolved itself she has had no further hemoptysis and and is due a repeat CT scan of the chest in November per pulmonary        Other   Iron deficiency anemia due to chronic blood loss - Primary    Anemia due to chronic blood loss for this we will repeat CBC and iron studies and hold further iron supplements pending results also  get her back to her gynecologist to assess menorrhagia with regular  cycle      Relevant Orders   Ambulatory referral to Gynecology   CBC with Differential/Platelet   Iron, TIBC and Ferritin Panel   Prediabetes    Prior history of documented prediabetes in 2019 however blood sugars in June of this year were normal      Menorrhagia with regular cycle    History of menorrhagia with regular cycle likely source of iron deficiency anemia will refer back to gynecology      Relevant Orders   Ambulatory referral to Gynecology   CBC with Differential/Platelet   Iron, TIBC and Ferritin Panel   Breast calcification seen on mammogram    Prior diagnostic mammogram showing calcifications in breast they recommend repeat imaging in August this is yet to occur we will go ahead and order the studies at this time at the patient's preferred [place] imaging center      Relevant Orders   MM Digital Diagnostic Bilat   BMI 31.0-31.9,adult    BMI of 31 proper diet and exercise program educated to the patient      Other Visit Diagnoses     Cervical cancer screening       Relevant Orders   Ambulatory referral to Gynecology   Encounter for health-related screening       Relevant Orders   Comprehensive metabolic panel   Lipid panel       Outpatient Encounter Medications as of 11/22/2020  Medication Sig   Iron, Ferrous Sulfate, 325 (65 Fe) MG TABS Take 325 mg by mouth daily.   No facility-administered encounter medications on file as of 11/22/2020.  Flu vaccine given this visit 38 minutes spent on this visit complex medical decision making and need for [place] interpreter multiple problems assessed records had to be reviewed including outside records on care everywhere to determine patient's primary care gap needs Follow-up: Return in about 4 months (around 03/24/2021).   Shan Levans, MD

## 2020-11-22 ENCOUNTER — Other Ambulatory Visit: Payer: Self-pay

## 2020-11-22 ENCOUNTER — Ambulatory Visit: Payer: BC Managed Care – PPO | Attending: Critical Care Medicine | Admitting: Critical Care Medicine

## 2020-11-22 ENCOUNTER — Encounter: Payer: Self-pay | Admitting: Critical Care Medicine

## 2020-11-22 VITALS — BP 97/64 | HR 86 | Ht 66.0 in | Wt 197.0 lb

## 2020-11-22 DIAGNOSIS — J479 Bronchiectasis, uncomplicated: Secondary | ICD-10-CM | POA: Diagnosis not present

## 2020-11-22 DIAGNOSIS — N920 Excessive and frequent menstruation with regular cycle: Secondary | ICD-10-CM | POA: Diagnosis not present

## 2020-11-22 DIAGNOSIS — Z23 Encounter for immunization: Secondary | ICD-10-CM | POA: Diagnosis not present

## 2020-11-22 DIAGNOSIS — R921 Mammographic calcification found on diagnostic imaging of breast: Secondary | ICD-10-CM | POA: Diagnosis not present

## 2020-11-22 DIAGNOSIS — D500 Iron deficiency anemia secondary to blood loss (chronic): Secondary | ICD-10-CM | POA: Diagnosis not present

## 2020-11-22 DIAGNOSIS — Z124 Encounter for screening for malignant neoplasm of cervix: Secondary | ICD-10-CM

## 2020-11-22 DIAGNOSIS — R7303 Prediabetes: Secondary | ICD-10-CM

## 2020-11-22 DIAGNOSIS — Z6831 Body mass index (BMI) 31.0-31.9, adult: Secondary | ICD-10-CM

## 2020-11-22 DIAGNOSIS — Z139 Encounter for screening, unspecified: Secondary | ICD-10-CM

## 2020-11-22 NOTE — Assessment & Plan Note (Signed)
BMI of 31 proper diet and exercise program educated to the patient

## 2020-11-22 NOTE — Assessment & Plan Note (Signed)
History of menorrhagia with regular cycle likely source of iron deficiency anemia will refer back to gynecology

## 2020-11-22 NOTE — Assessment & Plan Note (Signed)
History of bronchiectasis involving medial left upper lobe with distal airway opacification segmental lobe collapse this is likely the source of hemoptysis.  There is lobar gas trapping.  This has resolved itself she has had no further hemoptysis and and is due a repeat CT scan of the chest in November per pulmonary

## 2020-11-22 NOTE — Assessment & Plan Note (Signed)
Prior history of documented prediabetes in 2019 however blood sugars in June of this year were normal

## 2020-11-22 NOTE — Assessment & Plan Note (Signed)
Prior diagnostic mammogram showing calcifications in breast they recommend repeat imaging in August this is yet to occur we will go ahead and order the studies at this time at the patient's preferred [place] imaging center

## 2020-11-22 NOTE — Patient Instructions (Addendum)
Mammogram will be scheduled at [facility] radiology in [facility]  Referral back to gynecology Dr. Cliffton Asters in [facility] will be made for your Pap smear and excess bleeding with your menstrual periods this does not need to occur until after the new year  Labs today include iron levels blood counts metabolic panel cholesterol panel  Flu vaccine was given  Follow a healthy diet as attached and walk about 30 to 40 minutes 4 times a week to help with weight reduction  Return to see Dr. Delford Field 5 months  We will call you with your lab results with a [place] interpreter  The lung doctor will schedule your follow-up CAT scan   La mammographie sera programme  la [facility]  [place]  Le renvoi au Dr Cliffton Asters en gyncologie  [employer] sera fait pour votre frottis de Pap et les saignements excessifs avec vos menstruations, cela n'a pas besoin de se produire Chief of Staff la nouvelle anne  Les laboratoires incluent aujourd'hui les niveaux de fer la numration Oceanographer de cholestrol  Le vaccin contre la grippe a t Radiographer, therapeutic un rgime alimentaire sain et [employer] environ 30  40 minutes 4 fois par semaine pour aider  perdre du poids  [employer] pneumologue programmera votre tomodensitomtrie de suivi  Retourner voir le Dr Delford Field 5 mois  Nous vous appellerons avec vos rsultats de laboratoire avec un interprte franais

## 2020-11-22 NOTE — Assessment & Plan Note (Signed)
Anemia due to chronic blood loss for this we will repeat CBC and iron studies and hold further iron supplements pending results also get her back to her gynecologist to assess menorrhagia with regular cycle

## 2020-11-23 LAB — COMPREHENSIVE METABOLIC PANEL
ALT: 13 IU/L (ref 0–32)
AST: 11 IU/L (ref 0–40)
Albumin/Globulin Ratio: 1.4 (ref 1.2–2.2)
Albumin: 4.3 g/dL (ref 3.8–4.8)
Alkaline Phosphatase: 75 IU/L (ref 44–121)
BUN/Creatinine Ratio: 16 (ref 9–23)
BUN: 12 mg/dL (ref 6–24)
Bilirubin Total: <0.2 mg/dL (ref 0.0–1.2)
CO2: 26 mmol/L (ref 20–29)
Calcium: 9.2 mg/dL (ref 8.7–10.2)
Chloride: 104 mmol/L (ref 96–106)
Creatinine, Ser: 0.75 mg/dL (ref 0.57–1.00)
Globulin, Total: 3.1 g/dL (ref 1.5–4.5)
Glucose: 77 mg/dL (ref 65–99)
Potassium: 4.6 mmol/L (ref 3.5–5.2)
Sodium: 141 mmol/L (ref 134–144)
Total Protein: 7.4 g/dL (ref 6.0–8.5)
eGFR: 102 mL/min/{1.73_m2} (ref >59)

## 2020-11-23 LAB — CBC WITH DIFFERENTIAL/PLATELET
Basophils Absolute: 0 10*3/uL (ref 0.0–0.2)
Basos: 1 %
EOS (ABSOLUTE): 0.1 10*3/uL (ref 0.0–0.4)
Eos: 1 %
Hematocrit: 35.6 % (ref 34.0–46.6)
Hemoglobin: 11.7 g/dL (ref 11.1–15.9)
Immature Grans (Abs): 0 10*3/uL (ref 0.0–0.1)
Immature Granulocytes: 0 %
Lymphocytes Absolute: 2.9 10*3/uL (ref 0.7–3.1)
Lymphs: 47 %
MCH: 24.9 pg — ABNORMAL LOW (ref 26.6–33.0)
MCHC: 32.9 g/dL (ref 31.5–35.7)
MCV: 76 fL — ABNORMAL LOW (ref 79–97)
Monocytes Absolute: 0.5 10*3/uL (ref 0.1–0.9)
Monocytes: 7 %
Neutrophils Absolute: 2.7 10*3/uL (ref 1.4–7.0)
Neutrophils: 44 %
Platelets: 244 10*3/uL (ref 150–450)
RBC: 4.7 x10E6/uL (ref 3.77–5.28)
RDW: 14.5 % (ref 11.7–15.4)
WBC: 6.1 10*3/uL (ref 3.4–10.8)

## 2020-11-23 LAB — IRON,TIBC AND FERRITIN PANEL
Ferritin: 73 ng/mL (ref 15–150)
Iron Saturation: 22 % (ref 15–55)
Iron: 49 ug/dL (ref 27–159)
Total Iron Binding Capacity: 219 ug/dL — ABNORMAL LOW (ref 250–450)
UIBC: 170 ug/dL (ref 131–425)

## 2020-11-23 LAB — LIPID PANEL
Chol/HDL Ratio: 4.3 ratio (ref 0.0–4.4)
Cholesterol, Total: 168 mg/dL (ref 100–199)
HDL: 39 mg/dL — ABNORMAL LOW (ref >39)
LDL Chol Calc (NIH): 116 mg/dL — ABNORMAL HIGH (ref 0–99)
Triglycerides: 68 mg/dL (ref 0–149)
VLDL Cholesterol Cal: 13 mg/dL (ref 5–40)

## 2021-01-31 ENCOUNTER — Ambulatory Visit (INDEPENDENT_AMBULATORY_CARE_PROVIDER_SITE_OTHER)
Admission: RE | Admit: 2021-01-31 | Discharge: 2021-01-31 | Disposition: A | Discharge status: Home / Self Care | Payer: BC Managed Care – PPO | Source: Ambulatory Visit | Attending: Pulmonary Disease | Admitting: Pulmonary Disease

## 2021-01-31 ENCOUNTER — Other Ambulatory Visit: Payer: Self-pay

## 2021-01-31 ENCOUNTER — Telehealth: Payer: Self-pay | Admitting: Pulmonary Disease

## 2021-01-31 DIAGNOSIS — J479 Bronchiectasis, uncomplicated: Secondary | ICD-10-CM | POA: Diagnosis not present

## 2021-02-01 NOTE — Telephone Encounter (Signed)
LMTCB  Patient would like results called to her via french interpreter.   JD please advise of CT results.

## 2021-02-01 NOTE — Telephone Encounter (Signed)
Noted.   Will await a call back from patient.

## 2021-02-01 NOTE — Telephone Encounter (Signed)
Please let patient know that there is Stable anterior scarring in the left upper lobe with an are of bronchiectasis. This likely represents a chronic postinflammatory appearance.  There is also a small pulmonary nodule of the left lower lobe. She will need a CT chest without contrast to monitor this nodule in May 2023. Please order this scan and schedule her for follow up in clinic after the scan.  Thanks, Cletis Athens

## 2021-02-14 NOTE — Telephone Encounter (Signed)
Lmtcb for pt via [place] interpreters.

## 2021-03-21 ENCOUNTER — Ambulatory Visit: Payer: BC Managed Care – PPO | Admitting: Critical Care Medicine

## 2021-03-22 ENCOUNTER — Ambulatory Visit: Payer: BC Managed Care – PPO | Attending: Critical Care Medicine | Admitting: Critical Care Medicine

## 2021-03-22 ENCOUNTER — Encounter: Payer: Self-pay | Admitting: Critical Care Medicine

## 2021-03-22 ENCOUNTER — Other Ambulatory Visit: Payer: Self-pay

## 2021-03-22 VITALS — BP 111/76 | HR 82 | Resp 16 | Wt 199.4 lb

## 2021-03-22 DIAGNOSIS — R911 Solitary pulmonary nodule: Secondary | ICD-10-CM

## 2021-03-22 DIAGNOSIS — N920 Excessive and frequent menstruation with regular cycle: Secondary | ICD-10-CM

## 2021-03-22 DIAGNOSIS — R921 Mammographic calcification found on diagnostic imaging of breast: Secondary | ICD-10-CM | POA: Diagnosis not present

## 2021-03-22 DIAGNOSIS — J479 Bronchiectasis, uncomplicated: Secondary | ICD-10-CM

## 2021-03-22 DIAGNOSIS — Z124 Encounter for screening for malignant neoplasm of cervix: Secondary | ICD-10-CM | POA: Diagnosis not present

## 2021-03-22 DIAGNOSIS — D500 Iron deficiency anemia secondary to blood loss (chronic): Secondary | ICD-10-CM

## 2021-03-22 DIAGNOSIS — R7303 Prediabetes: Secondary | ICD-10-CM

## 2021-03-22 HISTORY — DX: Solitary pulmonary nodule: R91.1

## 2021-03-22 NOTE — Progress Notes (Signed)
Established Patient Office Visit  Subjective:  Patient ID: Amber Alvarado, female    DOB: [birth year]  Age: 41 y.o. MRN: [id number]  CC:  Chief Complaint  Patient presents with   Anemia    HPI Amber Alvarado presents for primary care follow-up of iron deficiency anemia.  Visit today January 4 is facilitated by [place] interpreter Amber Alvarado on the video [phone]  Ov 11/2020 Amber Alvarado presents for establish primary care.  Patient was seen previously in June by Bethany Beach.  Documentation is as below     Saw [name] 08/2020 Amber Alvarado is a 41 y.o. female here today to establish care and for a follow up visit After ED visit 06/30/2020 for blood in her saliva.  This has resolved completely.  No SOB.  No cough.  No CP.     Incidentally, she was found to have a renal mass and needs follow up MRI.  No urinary issues.  No N/V/D.  Appetite is good.     H/o heavy periods and anemia.  Not on iron.  Hgb =10.5 at ED.     From ED note:   Patient here with intermittent spots of blood in her saliva and mucus.    Chest x-ray in triage shows no acute abnormalities. Screening labs will be obtained to evaluate for thrombocytopenia.   Hemoglobin slightly decreased at 10.5 from 12 2 years ago.  Platelet count is stable.  D-dimer is positive.   CT scan shows no pulmonary embolism but does show bronchiectasis in the left upper lobe with distal airway opacification.  Patient informed of left renal mass and need for MRI follow-up.   D/w Dr. Gala Murdoch.  Patient is stable.  She can follow-up in the office.  He does agree with Levaquin 750 mg x 5 days.  Return to the ED if symptoms worsen. Follow up with PCP and pulmonary. Patient aware of need for MRI of renal finding.  No hypoxia or increased work of breathing. No witnessed hemoptysis in the ED.  Return precautions discussed.     ED/Hospital notes reviewed.   Social History:  From [place] but in [place] x 7  years   1. Iron deficiency anemia due to chronic blood loss Start iron sulfate 325 mg daily   2. Renal mass - MR Abdomen W Wo Contrast; Future - Basic metabolic panel   3. Other specified disorders of kidney and ureter - MR Abdomen W Wo Contrast; Future - Basic metabolic panel   4. Language barrier [employer]) interpreters used and additional time performing visit was required.     5. Encounter for examination following treatment at hospital Cough and blood in sputum resolved.  Saw pulmonary 07/26/2020-incomplete note in Epic as of today's date   Note language barrier at today's visit is assisted by in person interpreter Elmyra Ricks for [place] The patient's had no further hemoptysis since that visit.  She did see pulmonary and they detected no abnormalities she had finished her course of Levaquin and they recommended repeat CT scan of the chest in 6 months from the visit in May which would be in November of this year.  Note patient has had previous menorrhagia with normal cycle with iron deficiency document Iberet by her gynecologist who is in [facility].  She is due another Pap smear in January and wishes to return to that gynecologist.  Also she had diagnostic mammograms done in February of this year in the [facility] imaging center.  She  has calcifications in both breast they recommended repeat imaging in 6 months in August she is yet to have these imagings performed.  She is due a flu vaccine and she will accept this.  The only medications he is currently taking his iron supplement.  She is about out of this medication and we will have her run out of this as we will assess her lab data at this visit.  She has no other complaints at this time.  She does work doing Dealer work.  She immigrated from [place] 7 years ago.  She had a tetanus shot before she immigrated.  03/22/20 Since the last visit the patient has been doing well.  She had stopped her iron supplements when she had normal  iron levels and hemoglobin at the last visit.  She has no real complaints at this time.  She had a mammogram in October which was negative needs to be repeated in 6 months and this has been ordered.  Patient does need gynecology follow-up for a Pap smear this month.  We attempted to get her in with her gynecologist previously but patient did not answer her phone we will make another attempt to make this referral. Patient did follow back up with pulmonary had a CT scan of the chest in November which was negative for acute process she does have an lung nodule that needs to be rechecked in May.  The patient did not get this information we did share it with her at this visit Past Medical History:  Diagnosis Date   Iron deficiency anemia due to chronic blood loss 08/07/2017    History reviewed. No pertinent surgical history.  Family History  Problem Relation Age of Onset   Diabetes Father     Social History   Socioeconomic History   Marital status: Married    Spouse name: Not on file   Number of children: Not on file   Years of education: Not on file   Highest education level: Not on file  Occupational History   Not on file  Tobacco Use   Smoking status: Never   Smokeless tobacco: Never  Substance and Sexual Activity   Alcohol use: Not on file   Drug use: Not on file   Sexual activity: Yes  Other Topics Concern   Not on file  Social History Narrative   Not on file   Social Determinants of Health   Financial Resource Strain: Not on file  Food Insecurity: Not on file  Transportation Needs: Not on file  Physical Activity: Not on file  Stress: Not on file  Social Connections: Not on file  Intimate Partner Violence: Not on file    Outpatient Medications Prior to Visit  Medication Sig Dispense Refill   Iron, Ferrous Sulfate, 325 (65 Fe) MG TABS Take 325 mg by mouth daily. 100 tablet 1   No facility-administered medications prior to visit.    No Known  Allergies  ROS Review of Systems  Constitutional: Negative.   HENT: Negative.  Negative for ear pain, postnasal drip, rhinorrhea, sinus pressure, sore throat, trouble swallowing and voice change.   Eyes: Negative.   Respiratory: Negative.  Negative for apnea, cough, choking, chest tightness, shortness of breath, wheezing and stridor.   Cardiovascular: Negative.  Negative for chest pain, palpitations and leg swelling.  Gastrointestinal: Negative.  Negative for abdominal distention, abdominal pain, nausea and vomiting.  Genitourinary: Negative.   Musculoskeletal: Negative.  Negative for arthralgias and myalgias.  Skin: Negative.  Negative for rash.  Allergic/Immunologic: Negative.  Negative for environmental allergies and food allergies.  Neurological: Negative.  Negative for dizziness, syncope, weakness and headaches.  Hematological: Negative.  Negative for adenopathy. Does not bruise/bleed easily.  Psychiatric/Behavioral: Negative.  Negative for agitation and sleep disturbance. The patient is not nervous/anxious.      Objective:    Physical Exam Vitals reviewed.  Constitutional:      Appearance: Normal appearance. She is well-developed. She is obese. She is not diaphoretic.  HENT:     Head: Normocephalic and atraumatic.     Nose: No nasal deformity, septal deviation, mucosal edema or rhinorrhea.     Right Sinus: No maxillary sinus tenderness or frontal sinus tenderness.     Left Sinus: No maxillary sinus tenderness or frontal sinus tenderness.     Mouth/Throat:     Pharynx: No oropharyngeal exudate.  Eyes:     General: No scleral icterus.    Conjunctiva/sclera: Conjunctivae normal.     Pupils: Pupils are equal, round, and reactive to light.  Neck:     Thyroid: No thyromegaly.     Vascular: No carotid bruit or JVD.     Trachea: Trachea normal. No tracheal tenderness or tracheal deviation.  Cardiovascular:     Rate and Rhythm: Normal rate and regular rhythm.     Chest Wall:  PMI is not displaced.     Pulses: Normal pulses. No decreased pulses.     Heart sounds: Normal heart sounds, S1 normal and S2 normal. Heart sounds not distant. No murmur heard. No systolic murmur is present.  No diastolic murmur is present.    No friction rub. No gallop. No S3 or S4 sounds.  Pulmonary:     Effort: No tachypnea, accessory muscle usage or respiratory distress.     Breath sounds: No stridor. No decreased breath sounds, wheezing, rhonchi or rales.  Chest:     Chest wall: No tenderness.  Abdominal:     General: Bowel sounds are normal. There is no distension.     Palpations: Abdomen is soft. Abdomen is not rigid.     Tenderness: There is no abdominal tenderness. There is no guarding or rebound.  Musculoskeletal:        General: Normal range of motion.     Cervical back: Normal range of motion and neck supple. No edema, erythema or rigidity. No muscular tenderness. Normal range of motion.  Lymphadenopathy:     Head:     Right side of head: No submental or submandibular adenopathy.     Left side of head: No submental or submandibular adenopathy.     Cervical: No cervical adenopathy.  Skin:    General: Skin is warm and dry.     Coloration: Skin is not pale.     Findings: No rash.     Nails: There is no clubbing.  Neurological:     Mental Status: She is alert and oriented to person, place, and time.     Sensory: No sensory deficit.  Psychiatric:        Speech: Speech normal.        Behavior: Behavior normal.    BP 111/76    Pulse 82    Resp 16    Wt 199 lb 6.4 oz (90.4 kg)    SpO2 100%    BMI 32.18 kg/m  Wt Readings from Last 3 Encounters:  03/22/21 199 lb 6.4 oz (90.4 kg)  11/22/20 197 lb (89.4 kg)  08/17/20 198 lb 9.6  oz (90.1 kg)   Mammo 12/2020: FINDINGS: Cc and MLO views of the left breast, spot magnification cc and lateral views of the left breast are submitted. There is a group of layering calcifications in the posterior lower-inner quadrant left breast  unchanged. Left breast parenchyma is unchanged.  IMPRESSION: Probable benign findings.  RECOMMENDATION: Bilateral diagnostic mammogram in 6 months.  Health Maintenance Due  Topic Date Due   PAP SMEAR-Modifier  04/07/2021    There are no preventive care reminders to display for this patient.  Lab Results  Component Value Date   TSH 1.420 04/07/2018   Lab Results  Component Value Date   WBC 6.1 11/22/2020   HGB 11.7 11/22/2020   HCT 35.6 11/22/2020   MCV 76 (L) 11/22/2020   PLT 244 11/22/2020   Lab Results  Component Value Date   NA 141 11/22/2020   K 4.6 11/22/2020   CO2 26 11/22/2020   GLUCOSE 77 11/22/2020   BUN 12 11/22/2020   CREATININE 0.75 11/22/2020   BILITOT <0.2 11/22/2020   ALKPHOS 75 11/22/2020   AST 11 11/22/2020   ALT 13 11/22/2020   PROT 7.4 11/22/2020   ALBUMIN 4.3 11/22/2020   CALCIUM 9.2 11/22/2020   ANIONGAP 3 (L) 07/01/2020   EGFR 102 11/22/2020   Lab Results  Component Value Date   CHOL 168 11/22/2020   Lab Results  Component Value Date   HDL 39 (L) 11/22/2020   Lab Results  Component Value Date   LDLCALC 116 (H) 11/22/2020   Lab Results  Component Value Date   TRIG 68 11/22/2020   Lab Results  Component Value Date   CHOLHDL 4.3 11/22/2020   Lab Results  Component Value Date   HGBA1C 5.8 (H) 04/07/2018      Assessment & Plan:   Problem List Items Addressed This Visit       Respiratory   Bronchiectasis ([place])    Bronchiectasis stable at this time monitor for now        Other   Prediabetes    Following with diet control alone      Menorrhagia with regular cycle    Patient states her menorrhagia with regular cycle has improved we will monitor      Breast calcification seen on mammogram    Breast calcifications seen on mammogram are benign will be rechecked again in 6 months      Lung nodule, solitary    Seen in left lower lobe on recent CT scan of the chest likely benign  Follow-up per pulmonary repeat CT  scanning May 2023 per pulmonary      Cervical cancer screening    Needs repeat Pap smear have made a referral back to her gynecologist told patient to be on the look out for phone call      Relevant Orders   Ambulatory referral to Gynecology   RESOLVED: Iron deficiency anemia due to chronic blood loss - Primary    This has resolved with normal iron levels normal hemoglobin off iron supplement       No orders of the defined types were placed in this encounter.   Follow-up: Return in about 6 months (around 09/19/2021).    Asencion Noble, MD

## 2021-03-22 NOTE — Assessment & Plan Note (Signed)
This has resolved with normal iron levels normal hemoglobin off iron supplement

## 2021-03-22 NOTE — Assessment & Plan Note (Signed)
Bronchiectasis stable at this time monitor for now

## 2021-03-22 NOTE — Assessment & Plan Note (Signed)
Needs repeat Pap smear have made a referral back to her gynecologist told patient to be on the look out for phone call

## 2021-03-22 NOTE — Assessment & Plan Note (Signed)
Seen in left lower lobe on recent CT scan of the chest likely benign  Follow-up per pulmonary repeat CT scanning May 2023 per pulmonary

## 2021-03-22 NOTE — Assessment & Plan Note (Signed)
Patient states her menorrhagia with regular cycle has improved we will monitor

## 2021-03-22 NOTE — Assessment & Plan Note (Signed)
Breast calcifications seen on mammogram are benign will be rechecked again in 6 months

## 2021-03-22 NOTE — Assessment & Plan Note (Signed)
Following with diet control alone

## 2021-03-22 NOTE — Patient Instructions (Addendum)
A referral to Dr. Cliffton Asters will be made to have a Pap smear  You may stay off the iron tablets as you have already stopped  No new medications needed  Return to see Dr. Delford Field 6 months    Une rfrence au Dr Cliffton Asters sera faite pour avoir un frottis de Pap  Vous pouvez rester  l'cart des comprims de fer car vous avez dj arrt  Aucun nouveau mdicament ncessaire  Retourner voir le Dr Delford Field 6 mois

## 2021-07-18 ENCOUNTER — Ambulatory Visit: Payer: BC Managed Care – PPO | Attending: Critical Care Medicine | Admitting: Critical Care Medicine

## 2021-07-18 ENCOUNTER — Encounter: Payer: Self-pay | Admitting: Critical Care Medicine

## 2021-07-18 VITALS — BP 116/81 | HR 68 | Wt 199.8 lb

## 2021-07-18 DIAGNOSIS — R7303 Prediabetes: Secondary | ICD-10-CM

## 2021-07-18 DIAGNOSIS — R911 Solitary pulmonary nodule: Secondary | ICD-10-CM | POA: Diagnosis not present

## 2021-07-18 DIAGNOSIS — Z6831 Body mass index (BMI) 31.0-31.9, adult: Secondary | ICD-10-CM

## 2021-07-18 DIAGNOSIS — R718 Other abnormality of red blood cells: Secondary | ICD-10-CM

## 2021-07-18 DIAGNOSIS — Z124 Encounter for screening for malignant neoplasm of cervix: Secondary | ICD-10-CM

## 2021-07-18 DIAGNOSIS — R928 Other abnormal and inconclusive findings on diagnostic imaging of breast: Secondary | ICD-10-CM | POA: Diagnosis not present

## 2021-07-18 DIAGNOSIS — R918 Other nonspecific abnormal finding of lung field: Secondary | ICD-10-CM

## 2021-07-18 DIAGNOSIS — N920 Excessive and frequent menstruation with regular cycle: Secondary | ICD-10-CM

## 2021-07-18 DIAGNOSIS — J479 Bronchiectasis, uncomplicated: Secondary | ICD-10-CM | POA: Diagnosis not present

## 2021-07-18 NOTE — Assessment & Plan Note (Signed)
Needs follow-up imaging of chest will order ?

## 2021-07-18 NOTE — Assessment & Plan Note (Signed)
Calcifications seen in left breast we will repeat imaging ?

## 2021-07-18 NOTE — Assessment & Plan Note (Signed)
Heavy bleeding has come under control follow-up per gynecology ?

## 2021-07-18 NOTE — Assessment & Plan Note (Signed)
CBC improving microcytosis improving iron levels now normal now off iron supplementation ?

## 2021-07-18 NOTE — Assessment & Plan Note (Signed)
Trying to follow a healthy diet ?

## 2021-07-18 NOTE — Assessment & Plan Note (Signed)
Bronchiectasis left upper lobe improved on last CT imaging study ? ?No additional antibiotics indicated ?

## 2021-07-18 NOTE — Assessment & Plan Note (Signed)
History of prediabetes now following a healthy diet no medications indicated ?

## 2021-07-18 NOTE — Assessment & Plan Note (Signed)
Pap smear recently performed was normal repeat in 3 years ?

## 2021-07-18 NOTE — Patient Instructions (Signed)
Repeat CT scan of the lungs will be obtained we will call you the results ? ?Repeat mammogram of the left breast will be obtained we will call you the results ? ?Return to see Dr. Delford Field 5 months sooner if needed ? ? ?Une nouvelle tomodensitom?trie des poumons sera obtenue, nous vous appellerons les r?sultats ? ?Une mammographie de r?p?tition du sein gauche sera obtenue, nous vous appellerons les r?sultats ? ?Retournez voir le Dr Delford Field 5 mois plus t?t si n?cessaire ?

## 2021-07-18 NOTE — Progress Notes (Signed)
? ?Established Patient Office Visit ? ?Subjective:  ?Patient ID: Amber Alvarado, female    DOB: 1981-01-18  Age: 41 y.o. MRN: [id number] ? ?CC:  ?Primary care follow-up ? ?HPI ?11/2020 ?Amber Alvarado presents for primary care follow-up of iron deficiency anemia. ? ?Visit today January 4 is facilitated by [place] interpreter Leonette Most on the video [phone] ? ?Ov 11/2020 ?Amber Alvarado presents for establish primary care.  Patient was seen previously in June by PA [place].  Documentation is as below ?  ?  ?Saw [name] 08/2020 ?Amber Alvarado is a 41 y.o. female here today to establish care and for a follow up visit After ED visit 06/30/2020 for blood in her saliva.  This has resolved completely.  No SOB.  No cough.  No CP.   ?  ?Incidentally, she was found to have a renal mass and needs follow up MRI.  No urinary issues.  No N/V/D.  Appetite is good.   ?  ?H/o heavy periods and anemia.  Not on iron.  Hgb =10.5 at ED.   ?  ?From ED note: ?  ?Patient here with intermittent spots of blood in her saliva and mucus.  ?  ?Chest x-ray in triage shows no acute abnormalities. ?Screening labs will be obtained to evaluate for thrombocytopenia. ?  ?Hemoglobin slightly decreased at 10.5 from 12 2 years ago.  Platelet count is stable.  D-dimer is positive. ?  ?CT scan shows no pulmonary embolism but does show bronchiectasis in the left upper lobe with distal airway opacification.  Patient informed of left renal mass and need for MRI follow-up. ?  ?D/w Dr. Aldean Ast.  Patient is stable.  She can follow-up in the office.  He does agree with Levaquin 750 mg x 5 days.  Return to the ED if symptoms worsen. ?Follow up with PCP and pulmonary. Patient aware of need for MRI of renal finding.  ?No hypoxia or increased work of breathing. No witnessed hemoptysis in the ED.  ?Return precautions discussed.   ?  ?ED/Hospital notes reviewed.   ?Social History:  From [place] but in [place] x 7 years ?  ?1. Iron deficiency  anemia due to chronic blood loss ?Start iron sulfate 325 mg daily ?  ?2. Renal mass ?- MR Abdomen W Wo Contrast; Future ?- Basic metabolic panel ?  ?3. Other specified disorders of kidney and ureter ?- MR Abdomen W Wo Contrast; Future ?- Basic metabolic panel ?  ?4. Language barrier ?[employer] [facility]) interpreters used and additional time performing visit was required. ?  ?  ?5. Encounter for examination following treatment at hospital ?Cough and blood in sputum resolved.  Saw pulmonary 07/26/2020-incomplete note in Epic as of today's date ?  ?Note language barrier at today's visit is assisted by in person interpreter Joni Reining for [place] ?The patient's had no further hemoptysis since that visit.  She did see pulmonary and they detected no abnormalities she had finished her course of Levaquin and they recommended repeat CT scan of the chest in 6 months from the visit in May which would be in November of this year.  Note patient has had previous menorrhagia with normal cycle with iron deficiency document Iberet by her gynecologist who is in [facility].  She is due another Pap smear in January and wishes to return to that gynecologist.  Also she had diagnostic mammograms done in February of this year in the [facility] imaging center.  She has calcifications in both breast they recommended  repeat imaging in 6 months in August she is yet to have these imagings performed.  She is due a flu vaccine and she will accept this.  The only medications he is currently taking his iron supplement.  She is about out of this medication and we will have her run out of this as we will assess her lab data at this visit.  She has no other complaints at this time.  She does work doing Lobbyistelectrical work.  She immigrated from [place] 7 years ago.  She had a tetanus shot before she immigrated. ? ?03/22/20 ?Since the last visit the patient has been doing well.  She had stopped her iron supplements when she had normal iron levels and hemoglobin at  the last visit.  She has no real complaints at this time.  She had a mammogram in October which was negative needs to be repeated in 6 months and this has been ordered. ? ?Patient does need gynecology follow-up for a Pap smear this month.  We attempted to get her in with her gynecologist previously but patient did not answer her phone we will make another attempt to make this referral. ?Patient did follow back up with pulmonary had a CT scan of the chest in November which was negative for acute process she does have an lung nodule that needs to be rechecked in May.  The patient did not get this information we did share it with her at this visit ? ?5/2 ?This visit was assisted by [place] video interpreter Latifah [phone] ?Patient returns in follow-up for iron deficiency anemia her last iron levels were normal she is having no symptom complaints at this time.  She did have a CT scan performed previously that showed nodule in the left lower lobe this needs to be followed up she also had calcifications in the left breast on the mammogram in October and needs a follow-up mammogram per recommendation of radiology. ?Patient states her cough and breathing are markedly better.  Note patient did receive a Pap smear in April that was negative.  She is also had a complete pelvic exam and was recently treated for trichomonas infection received 2 courses of Flagyl with clearance of conditions.  She has no complaints at this time referable to her vaginal site. ? ?Past Medical History:  ?Diagnosis Date  ? Iron deficiency anemia due to chronic blood loss 08/07/2017  ? Microcytosis 08/07/2017  ? ? ?No past surgical history on file. ? ?Family History  ?Problem Relation Age of Onset  ? Diabetes Father   ? ? ?Social History  ? ?Socioeconomic History  ? Marital status: Married  ?  Spouse name: Not on file  ? Number of children: Not on file  ? Years of education: Not on file  ? Highest education level: Not on file  ?Occupational History  ?  Not on file  ?Tobacco Use  ? Smoking status: Never  ? Smokeless tobacco: Never  ?Substance and Sexual Activity  ? Alcohol use: Not on file  ? Drug use: Not on file  ? Sexual activity: Yes  ?Other Topics Concern  ? Not on file  ?Social History Narrative  ? Not on file  ? ?Social Determinants of Health  ? ?Financial Resource Strain: Not on file  ?Food Insecurity: Not on file  ?Transportation Needs: Not on file  ?Physical Activity: Not on file  ?Stress: Not on file  ?Social Connections: Not on file  ?Intimate Partner Violence: Not on file  ? ? ?  No outpatient medications prior to visit.  ? ?No facility-administered medications prior to visit.  ? ? ?No Known Allergies ? ?ROS ?Review of Systems  ?Constitutional: Negative.   ?HENT: Negative.  Negative for ear pain, postnasal drip, rhinorrhea, sinus pressure, sore throat, trouble swallowing and voice change.   ?Eyes: Negative.   ?Respiratory: Negative.  Negative for apnea, cough, choking, chest tightness, shortness of breath, wheezing and stridor.   ?Cardiovascular: Negative.  Negative for chest pain, palpitations and leg swelling.  ?Gastrointestinal: Negative.  Negative for abdominal distention, abdominal pain, nausea and vomiting.  ?Genitourinary: Negative.   ?Musculoskeletal: Negative.  Negative for arthralgias and myalgias.  ?Skin: Negative.  Negative for rash.  ?Allergic/Immunologic: Negative.  Negative for environmental allergies and food allergies.  ?Neurological: Negative.  Negative for dizziness, syncope, weakness and headaches.  ?Hematological: Negative.  Negative for adenopathy. Does not bruise/bleed easily.  ?Psychiatric/Behavioral: Negative.  Negative for agitation and sleep disturbance. The patient is not nervous/anxious.   ? ?  ?Objective:  ?  ?Physical Exam ?Vitals reviewed.  ?Constitutional:   ?   Appearance: Normal appearance. She is well-developed. She is obese. She is not diaphoretic.  ?HENT:  ?   Head: Normocephalic and atraumatic.  ?   Nose: No nasal  deformity, septal deviation, mucosal edema or rhinorrhea.  ?   Right Sinus: No maxillary sinus tenderness or frontal sinus tenderness.  ?   Left Sinus: No maxillary sinus tenderness or frontal sinus t

## 2021-08-17 ENCOUNTER — Ambulatory Visit
Admission: RE | Admit: 2021-08-17 | Discharge: 2021-08-17 | Disposition: A | Discharge status: Home / Self Care | Payer: BC Managed Care – PPO | Source: Ambulatory Visit | Attending: Critical Care Medicine | Admitting: Critical Care Medicine

## 2021-08-17 DIAGNOSIS — R918 Other nonspecific abnormal finding of lung field: Secondary | ICD-10-CM

## 2021-08-24 ENCOUNTER — Telehealth: Payer: Self-pay

## 2021-08-24 NOTE — Telephone Encounter (Signed)
-----   Message from Storm Frisk, MD sent at 08/21/2021  5:30 AM EDT ----- Let pt know lung nodule is unchanged and is benign  no further follow up needed

## 2021-08-24 NOTE — Telephone Encounter (Signed)
Pt was called and vm was left, Information has been sent to nurse pool.    interpreter QM#[id number]

## 2021-09-22 ENCOUNTER — Emergency Department (HOSPITAL_COMMUNITY)
Admission: EM | Admit: 2021-09-22 | Discharge: 2021-09-22 | Disposition: A | Discharge status: Home / Self Care | Payer: BC Managed Care – PPO | Attending: Emergency Medicine | Admitting: Emergency Medicine

## 2021-09-22 ENCOUNTER — Encounter (HOSPITAL_COMMUNITY): Payer: Self-pay

## 2021-09-22 ENCOUNTER — Other Ambulatory Visit: Payer: Self-pay

## 2021-09-22 DIAGNOSIS — H5789 Other specified disorders of eye and adnexa: Secondary | ICD-10-CM

## 2021-09-22 DIAGNOSIS — T65891A Toxic effect of other specified substances, accidental (unintentional), initial encounter: Secondary | ICD-10-CM | POA: Diagnosis not present

## 2021-09-22 DIAGNOSIS — H10213 Acute toxic conjunctivitis, bilateral: Secondary | ICD-10-CM | POA: Diagnosis not present

## 2021-09-22 MED ORDER — FLUORESCEIN SODIUM 1 MG OP STRP
1.0000 | ORAL_STRIP | Freq: Once | OPHTHALMIC | Status: AC
  Administered 2021-09-22: 1 via OPHTHALMIC
  Filled 2021-09-22: qty 1

## 2021-09-22 MED ORDER — HYPROMELLOSE (GONIOSCOPIC) 2.5 % OP SOLN: 2.0000 [drp] | Freq: Four times a day (QID) | OPHTHALMIC | 12 refills | Status: DC

## 2021-09-22 MED ORDER — TETRACAINE HCL 0.5 % OP SOLN
2.0000 [drp] | Freq: Once | OPHTHALMIC | Status: AC
  Administered 2021-09-22: 2 [drp] via OPHTHALMIC
  Filled 2021-09-22: qty 4

## 2021-09-22 MED ORDER — ERYTHROMYCIN 5 MG/GM OP OINT: TOPICAL_OINTMENT | OPHTHALMIC | 0 refills | Status: DC

## 2021-09-22 NOTE — ED Triage Notes (Signed)
Pt arrived POV from home stating she put black oil in her hair a couple days ago for a scalp treatment and it dripped into her eyes. She is having redness, itching and discomfort and tearful.

## 2021-09-22 NOTE — Discharge Instructions (Addendum)
You were seen in the emergency department for evaluation of bilateral eye redness and irritation secondary to black hair dye getting into her eyes.  Bedside examination did not show any changes to your eyes pH.  Woods lamp examination did not identify any signs of corneal abrasion.  You were given a prescription for artificial tears which we recommend taking 4 times a day.  We also recommend using erythromycin ophthalmic ointment over the coming days.  The information for follow-up with ophthalmology is listed above.  Please call this provider tomorrow to schedule close follow-up for recheck of your eyes.  Also follow-up with your primary care provider in the coming days for symptom recheck.

## 2021-09-22 NOTE — ED Provider Triage Note (Signed)
Emergency Medicine Provider Triage Evaluation Note  Amber Alvarado , a 41 y.o. female  was evaluated in triage.  Pt complains of eye discomfort.  Patient used black oil in her hair couple days ago as a scalp treatment before washing.  Unfortunately the oil dripped down into her eyes.  Since that time she has had significant redness, discussed comfort and watering.  She also has sensitivity to light, no vision changes, she has no contact lenses.  Review of Systems  Positive: Eye discomfort Negative: [place]  Physical Exam  BP 124/77 (BP Location: Left Arm)   Pulse 78   Temp 99.2 F (37.3 C) (Oral)   Resp 16   SpO2 100%  Gen:   Awake, no distress   Resp:  Normal effort  MSK:   Moves extremities without difficulty  Other:  Bilateral eyes are angry, injected, constantly tearing.  Medical Decision Making  Medically screening exam initiated at 2:17 PM.  Appropriate orders placed.  Amber Alvarado was informed that the remainder of the evaluation will be completed by another provider, this initial triage assessment does not replace that evaluation, and the importance of remaining in the ED until their evaluation is complete.  Patient here with eye discomfort, consider allergic conjunctivitis or chemical conjunctivitis.    Arthor Captain, PA-C 09/22/21 1419

## 2021-09-23 NOTE — ED Provider Notes (Signed)
[facility] EMERGENCY DEPARTMENT Provider Note   CSN: [id number] Arrival date & time: 09/22/21  1208     History  Chief Complaint  Patient presents with   Eye Problem    Amber Alvarado is a 41 y.o. female.   Eye Problem  Patient is a 41 year old female with no significant past medical history who presents emergency department for evaluation of bilateral eye irritation.  [place] translator was used at bedside for the duration of the patient encounter.  According to the patient 2 days ago she was placing black hair dye in her hair and it had drained down her face and into her eyes.  She had initially cleaned her eyes out with water and was not having any issues initially, however yesterday she began having redness and ongoing tearing in her bilateral eyes.  She denied any visual changes or pain in her eyes.  Today the irritation in her eyes further worsened with ongoing redness and further tearing.  She has not been consistently using any eyedrops at home.  She does not wear any contacts.  She denies consistently rubbing her eyes.  She continues to deny pain in her eyes or any issues with moving her eyes around.  Her vision remains intact with no double vision or blurred vision.  She denies any other symptoms at this time.     Home Medications Prior to Admission medications   Medication Sig Start Date End Date Taking? Authorizing Provider  erythromycin ophthalmic ointment Place a 1/2 inch ribbon of ointment into the lower eyelid. 09/22/21   Tommie Raymond, MD  hydroxypropyl methylcellulose / hypromellose (ISOPTO TEARS / GONIOVISC) 2.5 % ophthalmic solution Place 2 drops into both eyes 4 (four) times daily. 09/22/21   Tommie Raymond, MD      Allergies    Patient has no known allergies.    Review of Systems   Review of Systems  Physical Exam Updated Vital Signs BP 133/90   Pulse 62   Temp 98.1 F (36.7 C)   Resp 16   SpO2 100%  Physical Exam Vitals  and nursing note reviewed.  Constitutional:      General: She is not in acute distress.    Appearance: She is well-developed.  Eyes:     Comments: Significant bilateral conjunctival injection without crusting or obvious drainage.  Tearing bilaterally.  pH 7 in both eyes.  Pupils equal round reactive to light.  Extraocular movements intact.  No corneal abrasion identified on fluorescein staining.  Visual acuity 20/20 in both eyes  Cardiovascular:     Rate and Rhythm: Normal rate and regular rhythm.     Heart sounds: No murmur heard. Pulmonary:     Effort: Pulmonary effort is normal.  Abdominal:     General: Abdomen is flat.  Musculoskeletal:        General: No swelling.     Cervical back: Neck supple.  Skin:    General: Skin is warm and dry.     Capillary Refill: Capillary refill takes less than 2 seconds.  Neurological:     Mental Status: She is alert.  Psychiatric:        Mood and Affect: Mood normal.     ED Results / Procedures / Treatments   Labs (all labs ordered are listed, but only abnormal results are displayed) Labs Reviewed - No data to display  EKG None  Radiology No results found.  Procedures Procedures    Medications Ordered in ED Medications  fluorescein ophthalmic strip 1 strip (1 strip Both Eyes Given 09/22/21 2240)  tetracaine (PONTOCAINE) 0.5 % ophthalmic solution 2 drop (2 drops Both Eyes Given 09/22/21 2240)    ED Course/ Medical Decision Making/ A&P                           Medical Decision Making Problems Addressed: Chemical conjunctivitis of both eyes: complicated acute illness or injury Eye irritation: complicated acute illness or injury  Risk OTC drugs. Prescription drug management.   Patient is a 41 year old female who presents emergency department as above.  On initial presentation patient's vital signs are stable and she is afebrile.  On physical exam she does have significant bilateral conjunctival injection.  In the setting of  concerns for chemical conjunctivitis pH was tested on both eyes and was within normal limits at 7.  She had reportedly only irrigated the eyes after the block.  I had initially gotten into her eyes.  A fluorescein stain was performed at bedside did not show any obvious signs of corneal abrasions.  Visual acuity was 20 out of 20 in both eyes and she does not wear contacts.  In the setting of no visual changes, good extraocular movements and no obvious corneal abrasion identified, patient was felt appropriate for continued management in the outpatient setting.  Information for ophthalmology was made available to the patient and she was instructed to schedule an appointment with them in the coming days for symptom recheck.  She was also prescribed artificial tears as well as erythromycin eye ointment to be used over the next several days.  Plan and findings discussed with the patient at bedside she verbalized understanding was agreeable.  Patient discharged from the ED in stable condition.        Final Clinical Impression(s) / ED Diagnoses Final diagnoses:  Chemical conjunctivitis of both eyes  Eye irritation    Rx / DC Orders ED Discharge Orders          Ordered    erythromycin ophthalmic ointment  Status:  Discontinued        09/22/21 2206    hydroxypropyl methylcellulose / hypromellose (ISOPTO TEARS / GONIOVISC) 2.5 % ophthalmic solution  4 times daily,   Status:  Discontinued        09/22/21 2206    erythromycin ophthalmic ointment        09/22/21 2245    hydroxypropyl methylcellulose / hypromellose (ISOPTO TEARS / GONIOVISC) 2.5 % ophthalmic solution  4 times daily        09/22/21 2245              Tommie Raymond, MD 09/23/21 3419    Amber Bucco, MD 09/26/21 [phone]

## 2021-09-28 ENCOUNTER — Telehealth: Payer: Self-pay | Admitting: Critical Care Medicine

## 2021-09-28 NOTE — Telephone Encounter (Signed)
Patient was seen in the Emergency Room on Friday July 7th for Conjunctivitis due to hair dye. She written 2 prescriptions but was only able to get one that being erythromycin ophthalmic ointment but she was unable to get the other which was hydroxypropyl methylcellulose / hypromellose (ISOPTO TEARS / GONIOVISC) 2.5 % ophthalmic solution [facility]. Is there an alternative medication to that the provider can prescribe so [facility]   [id number] [place], [place] - [address] Phone:  [phone]  Fax:  [phone]       Patient aware different donctor, the ER doctor wrote the prescriptions but didn't know what to do but to ask her medical provider. Please assist patient further.

## 2021-09-28 NOTE — Telephone Encounter (Signed)
Routing to PCP for review.

## 2021-09-29 NOTE — Telephone Encounter (Signed)
I would just get over the counter visine eye drops or systane eye drops to rinse eye and use the eye gel antibiotic

## 2021-09-29 NOTE — Telephone Encounter (Signed)
Call placed to patient and VM was left informing patient to return phone call. 

## 2021-10-10 ENCOUNTER — Ambulatory Visit
Admission: RE | Admit: 2021-10-10 | Discharge: 2021-10-10 | Disposition: A | Discharge status: Home / Self Care | Payer: BC Managed Care – PPO | Source: Ambulatory Visit | Attending: Critical Care Medicine | Admitting: Critical Care Medicine

## 2021-10-10 ENCOUNTER — Other Ambulatory Visit: Payer: Self-pay | Admitting: Critical Care Medicine

## 2021-10-10 DIAGNOSIS — R918 Other nonspecific abnormal finding of lung field: Secondary | ICD-10-CM

## 2021-10-10 DIAGNOSIS — R921 Mammographic calcification found on diagnostic imaging of breast: Secondary | ICD-10-CM

## 2021-10-10 DIAGNOSIS — R928 Other abnormal and inconclusive findings on diagnostic imaging of breast: Secondary | ICD-10-CM

## 2021-10-11 NOTE — Progress Notes (Signed)
Make sure pt know mammogram was normal recheck one year

## 2021-10-13 ENCOUNTER — Telehealth: Payer: Self-pay

## 2021-10-13 NOTE — Telephone Encounter (Signed)
-----   Message from Storm Frisk, MD sent at 10/11/2021  9:59 AM EDT ----- Make sure pt know mammogram was normal recheck one year

## 2021-10-13 NOTE — Telephone Encounter (Signed)
Pt was called and vm was left, Information has been sent to nurse pool.    Interpreter [phone]

## 2021-11-22 ENCOUNTER — Encounter: Payer: Self-pay | Admitting: Physician Assistant

## 2021-11-22 ENCOUNTER — Telehealth: Payer: Self-pay | Admitting: Physician Assistant

## 2021-11-22 ENCOUNTER — Ambulatory Visit: Payer: BC Managed Care – PPO | Attending: Physician Assistant | Admitting: Physician Assistant

## 2021-11-22 ENCOUNTER — Other Ambulatory Visit: Payer: Self-pay | Admitting: Critical Care Medicine

## 2021-11-22 VITALS — BP 115/79 | HR 83 | Ht 66.0 in | Wt 199.8 lb

## 2021-11-22 DIAGNOSIS — M62838 Other muscle spasm: Secondary | ICD-10-CM

## 2021-11-22 DIAGNOSIS — Z789 Other specified health status: Secondary | ICD-10-CM

## 2021-11-22 DIAGNOSIS — R079 Chest pain, unspecified: Secondary | ICD-10-CM

## 2021-11-22 MED ORDER — METHOCARBAMOL 500 MG PO TABS: 1000.0000 mg | ORAL_TABLET | Freq: Three times a day (TID) | ORAL | 0 refills | Status: DC | PRN

## 2021-11-22 MED ORDER — MELOXICAM 15 MG PO TABS: 15.0000 mg | ORAL_TABLET | Freq: Every day | ORAL | 0 refills | Status: DC

## 2021-11-22 NOTE — Patient Instructions (Signed)
Muscle Cramps and Spasms Muscle cramps and spasms are when muscles tighten by themselves. They usually get better within minutes. Muscle cramps are painful. They are usually stronger and last longer than muscle spasms. Muscle spasms may or may not be painful. They can last a few seconds or much longer. Cramps and spasms can affect any muscle, but they occur most often in the calf muscles of the leg. They are usually not caused by a serious problem. In many cases, the cause is not known. Some common causes include: Doing more physical work or exercise than your body is ready for. Using the muscles too much (overuse) by repeating certain movements too many times. Staying in a certain position for a long time. Playing a sport or doing an activity without preparing properly. Using bad form or technique while playing a sport or doing an activity. Not having enough water in your body (dehydration). Injury. Side effects of some medicines. Low levels of the salts and minerals in your blood (electrolytes), such as low potassium or calcium. Follow these instructions at home: Managing pain and stiffness     Massage, stretch, and relax the muscle. Do this for many minutes at a time. If told, put heat on tight or tense muscles as often as told by your doctor. Use the heat source that your doctor recommends, such as a moist heat pack or a heating pad. Place a towel between your skin and the heat source. Leave the heat on for 20-30 minutes. Remove the heat if your skin turns bright red. This is very important if you are not able to feel pain, heat, or cold. You may have a greater risk of getting burned. If told, put ice on the affected area. This may help if you are sore or have pain after a cramp or spasm. Put ice in a plastic bag. Place a towel between your skin and the bag. Leave the ice on for 20 minutes, 2-3 times a day. Try taking hot showers or baths to help relax tight muscles. Eating and  drinking Drink enough fluid to keep your pee (urine) pale yellow. Eat a healthy diet to help ensure that your muscles work well. This should include: Fruits and vegetables. Lean protein. Whole grains. Low-fat or nonfat dairy products. General instructions If you are having cramps often, avoid intense exercise for several days. Take over-the-counter and prescription medicines only as told by your doctor. Watch for any changes in your symptoms. Keep all follow-up visits as told by your doctor. This is important. Contact a doctor if: Your cramps or spasms get worse or happen more often. Your cramps or spasms do not get better with time. Summary Muscle cramps and spasms are when muscles tighten by themselves. They usually get better within minutes. Cramps and spasms occur most often in the calf muscles of the leg. Massage, stretch, and relax the muscle. This may help the cramp or spasm go away. Drink enough fluid to keep your pee (urine) pale yellow. This information is not intended to replace advice given to you by your health care provider. Make sure you discuss any questions you have with your health care provider. Document Revised: 09/23/2020 Document Reviewed: 09/23/2020 Elsevier Patient Education  2023 Elsevier Inc.  

## 2021-11-22 NOTE — Telephone Encounter (Signed)
Methocarbamol and meloxicam sent to the wrong pharmacy.   Summary: Medication called into wrong pharmacy   Patient called in stating the prescribed medication was sent to the wrong pharmacy which is [facility]. That was on her pharmacy list but has been taken off because [facility] [address]  [facility] as soon as possible. Please assist patient further   [id number] [place], [place] - [address]  Phone: [phone]  Fax: [phone]

## 2021-11-22 NOTE — Telephone Encounter (Signed)
This encounter was created in error - please disregard.

## 2021-11-22 NOTE — Progress Notes (Signed)
Patient ID: Amber Alvarado, female   DOB: 09/11/1980, 41 y.o.   MRN: [id number]     Amber Alvarado, is a 41 y.o. female  RWE:[id number]  YPP:[id number]  DOB - 1981-02-12  Chief Complaint  Patient presents with   Breathing Problem       Subjective:   Amber Alvarado is a 41 y.o. female here today for 3 week h/o pain in lower l chest rib cage area.  NKI.  No numbness or weakness.  She wants to be on FMLA until her next appt with Dr Delford Field.  No rash.  Appetite is good.  Pain is worse with movement/lifting.  She has not tried OTC meds for the discomfort.    No problems updated.  ALLERGIES: No Known Allergies  PAST MEDICAL HISTORY: Past Medical History:  Diagnosis Date   Iron deficiency anemia due to chronic blood loss 08/07/2017   Microcytosis 08/07/2017    MEDICATIONS AT HOME: Prior to Admission medications   Medication Sig Start Date End Date Taking? Authorizing Provider  erythromycin ophthalmic ointment Place a 1/2 inch ribbon of ointment into the lower eyelid. 09/22/21  Yes Paternite, Onalee Hua, MD  hydroxypropyl methylcellulose / hypromellose (ISOPTO TEARS / GONIOVISC) 2.5 % ophthalmic solution Place 2 drops into both eyes 4 (four) times daily. 09/22/21  Yes Paternite, Onalee Hua, MD  meloxicam (MOBIC) 15 MG tablet Take 1 tablet (15 mg total) by mouth daily. 11/22/21  Yes Janny Crute, Marzella Schlein, PA-C  methocarbamol (ROBAXIN) 500 MG tablet Take 2 tablets (1,000 mg total) by mouth every 8 (eight) hours as needed for muscle spasms. 11/22/21  Yes Shaquira Moroz, Marzella Schlein, PA-C    ROS: Neg HEENT Neg resp Neg cardiac Neg GI Neg GU Neg MS Neg psych Neg neuro  Objective:   Vitals:   11/22/21 1354  BP: 115/79  Pulse: 83  SpO2: 100%  Weight: 199 lb 12.8 oz (90.6 kg)  Height: 5\' 6"  (1.676 m)   Exam General appearance : Awake, alert, not in any distress. Speech Clear. Not toxic looking HEENT: Atraumatic and Normocephalic Neck: Supple, no JVD. No cervical  lymphadenopathy.  Chest: Good air entry bilaterally, CTAB.  No rales/rhonchi/wheezing.  No TTP in L upper abdomen or L chest wall/ribs.  No rash CVS: S1 S2 regular, no murmurs.  Extremities: B/L Lower Ext shows no edema, both legs are warm to touch Neurology: Awake alert, and oriented X 3, CN II-XII intact, Non focal Skin: No Rash  Data Review Lab Results  Component Value Date   HGBA1C 5.8 (H) 04/07/2018    Assessment & Plan   1. Muscle spasm Likely strained/sprained muscle.  - meloxicam (MOBIC) 15 MG tablet; Take 1 tablet (15 mg total) by mouth daily.  Dispense: 30 tablet; Refill: 0 - methocarbamol (ROBAXIN) 500 MG tablet; Take 2 tablets (1,000 mg total) by mouth every 8 (eight) hours as needed for muscle spasms.  Dispense: 90 tablet; Refill: 0  2. Left-sided chest pain - meloxicam (MOBIC) 15 MG tablet; Take 1 tablet (15 mg total) by mouth daily.  Dispense: 30 tablet; Refill: 0 - methocarbamol (ROBAXIN) 500 MG tablet; Take 2 tablets (1,000 mg total) by mouth every 8 (eight) hours as needed for muscle spasms.  Dispense: 90 tablet; Refill: 0  3. Language barrier 04/09/2018 interpreter with patient from [facility]    Return for keep appt with Dr [facility] in October.  The patient was given clear instructions to go to ER or return to medical center if symptoms don't improve, worsen or  new problems develop. The patient verbalized understanding. The patient was told to call to get lab results if they haven't heard anything in the next week.      Georgian Co, PA-C [facility] [place], [place] [phone]   11/22/2021, 2:20 PM

## 2021-11-23 ENCOUNTER — Other Ambulatory Visit: Payer: Self-pay | Admitting: Pharmacist

## 2021-11-23 DIAGNOSIS — M62838 Other muscle spasm: Secondary | ICD-10-CM

## 2021-11-23 DIAGNOSIS — R079 Chest pain, unspecified: Secondary | ICD-10-CM

## 2021-11-23 MED ORDER — METHOCARBAMOL 500 MG PO TABS: 1000.0000 mg | ORAL_TABLET | Freq: Three times a day (TID) | ORAL | 0 refills | Status: DC | PRN

## 2021-11-23 MED ORDER — MELOXICAM 15 MG PO TABS: 15.0000 mg | ORAL_TABLET | Freq: Every day | ORAL | 0 refills | Status: DC

## 2021-12-18 ENCOUNTER — Ambulatory Visit: Payer: BC Managed Care – PPO | Admitting: Critical Care Medicine

## 2021-12-18 NOTE — Progress Notes (Signed)
New Patient Office Visit  Subjective:  Patient ID: Amber Alvarado, female    DOB: 1980-08-30  Age: 41 y.o. MRN: [id number]  CC:  Chief Complaint  Patient presents with   Follow-up    HPI Amber Alvarado presents for establish primary care.  Patient was seen previously in June by [name].  Documentation is as below  07/18/21  Saw [name] 08/2020 Amber Alvarado is a 41 y.o. female here today to establish care and for a follow up visit After ED visit 06/30/2020 for blood in her saliva.  This has resolved completely.  No SOB.  No cough.  No CP.     Incidentally, she was found to have a renal mass and needs follow up MRI.  No urinary issues.  No N/V/D.  Appetite is good.     H/o heavy periods and anemia.  Not on iron.  Hgb =10.5 at ED.     From ED note:   Patient here with intermittent spots of blood in her saliva and mucus.    Chest x-ray in triage shows no acute abnormalities. Screening labs will be obtained to evaluate for thrombocytopenia.   Hemoglobin slightly decreased at 10.5 from 12 2 years ago.  Platelet count is stable.  D-dimer is positive.   CT scan shows no pulmonary embolism but does show bronchiectasis in the left upper lobe with distal airway opacification.  Patient informed of left renal mass and need for MRI follow-up.   D/w Dr. Gala Murdoch.  Patient is stable.  She can follow-up in the office.  He does agree with Levaquin 750 mg x 5 days.  Return to the ED if symptoms worsen. Follow up with PCP and pulmonary. Patient aware of need for MRI of renal finding.  No hypoxia or increased work of breathing. No witnessed hemoptysis in the ED.  Return precautions discussed.     ED/Hospital notes reviewed.   Social History:  From [place] but in [place] x 7 years   1. Iron deficiency anemia due to chronic blood loss Start iron sulfate 325 mg daily   2. Renal mass - MR Abdomen W Wo Contrast; Future - Basic metabolic panel   3. Other specified  disorders of kidney and ureter - MR Abdomen W Wo Contrast; Future - Basic metabolic panel   4. Language barrier [facility] [employer]) interpreters used and additional time performing visit was required.     5. Encounter for examination following treatment at hospital Cough and blood in sputum resolved.  Saw pulmonary 07/26/2020-incomplete note in Epic as of today's date  Note language barrier at today's visit is assisted by in person interpreter Elmyra Ricks for [place] The patient's had no further hemoptysis since that visit.  She did see pulmonary and they detected no abnormalities she had finished her course of Levaquin and they recommended repeat CT scan of the chest in 6 months from the visit in May which would be in November of this year.  Note patient has had previous menorrhagia with normal cycle with iron deficiency document [place] by her gynecologist who is in [facility].  She is due another Pap smear in January and wishes to return to that gynecologist.  Also she had diagnostic mammograms done in February of this year in the [facility] imaging center.  She has calcifications in both breast they recommended repeat imaging in 6 months in August she is yet to have these imagings performed.  She is due a flu vaccine and she will  accept this.  The only medications he is currently taking his iron supplement.  She is about out of this medication and we will have her run out of this as we will assess her lab data at this visit.  She has no other complaints at this time.  She does work doing Lobbyist work.  She immigrated from [place] 7 years ago.  She had a tetanus shot before she immigrated.   10/3 Patient seen in return follow-up visit assisted with 6 [place] interpreter Georgeann Oppenheim [phone] Patient complains today of bilateral lower abdominal pain but no change in menstrual cycle vaginal discharge dysuria or other symptoms referable to the lower abdomen.  She denies any change in bowel habits nausea or  vomiting.  Pains been going on for several weeks.  Note at the last visit we scanned her chest and showed stable lung nodules likely benign.  She is no longer having any hemoptysis or cough.  She did agree and receive the flu vaccine at this visit.  Blood pressure on arrival is good 108/78 There are no other complaints.   Past Medical History:  Diagnosis Date   Iron deficiency anemia due to chronic blood loss 08/07/2017   Microcytosis 08/07/2017    History reviewed. No pertinent surgical history.  Family History  Problem Relation Age of Onset   Diabetes Father     Social History   Socioeconomic History   Marital status: Married    Spouse name: Not on file   Number of children: Not on file   Years of education: Not on file   Highest education level: Not on file  Occupational History   Not on file  Tobacco Use   Smoking status: Never   Smokeless tobacco: Never  Substance and Sexual Activity   Alcohol use: Not on file   Drug use: Not on file   Sexual activity: Yes  Other Topics Concern   Not on file  Social History Narrative   Not on file   Social Determinants of Health   Financial Resource Strain: Not on file  Food Insecurity: Not on file  Transportation Needs: Not on file  Physical Activity: Not on file  Stress: Not on file  Social Connections: Not on file  Intimate Partner Violence: Not on file    ROS Review of Systems  Constitutional:  Negative for chills, diaphoresis and fever.  HENT:  Negative for congestion, ear discharge, ear pain, hearing loss, nosebleeds, sore throat and tinnitus.   Eyes:  Negative for photophobia and discharge.  Respiratory: Negative.  Negative for cough, shortness of breath, wheezing and stridor.        No excess mucus  Cardiovascular:  Negative for chest pain, palpitations and leg swelling.  Gastrointestinal:  Positive for abdominal pain. Negative for abdominal distention, anal bleeding, blood in stool, constipation, diarrhea, nausea,  rectal pain and vomiting.  Endocrine: Negative for polydipsia.  Genitourinary:  Negative for decreased urine volume, difficulty urinating, dyspareunia, dysuria, enuresis, flank pain, frequency, hematuria, menstrual problem, pelvic pain, urgency, vaginal bleeding, vaginal discharge and vaginal pain.  Musculoskeletal:  Negative for back pain, myalgias and neck pain.  Skin:  Negative for rash.  Allergic/Immunologic: Negative for environmental allergies.  Neurological:  Negative for dizziness, tremors, seizures, weakness and headaches.  Hematological:  Does not bruise/bleed easily.  Psychiatric/Behavioral:  Negative for hallucinations and suicidal ideas. The patient is not nervous/anxious.   All other systems reviewed and are negative.   Objective:   Today's Vitals: BP 108/78  Pulse 88   Ht 5\' 6"  (1.676 m)   Wt 194 lb (88 kg)   LMP 12/08/2021   SpO2 99%   BMI 31.31 kg/m   Physical Exam Vitals reviewed.  Constitutional:      Appearance: Normal appearance. She is well-developed. She is not diaphoretic.  HENT:     Head: Normocephalic and atraumatic.     Nose: Nose normal. No nasal deformity, septal deviation, mucosal edema or rhinorrhea.     Right Sinus: No maxillary sinus tenderness or frontal sinus tenderness.     Left Sinus: No maxillary sinus tenderness or frontal sinus tenderness.     Mouth/Throat:     Mouth: Mucous membranes are moist.     Pharynx: Oropharynx is clear. No oropharyngeal exudate.  Eyes:     General: No scleral icterus.    Conjunctiva/sclera: Conjunctivae normal.     Pupils: Pupils are equal, round, and reactive to light.  Neck:     Thyroid: No thyromegaly.     Vascular: No carotid bruit or JVD.     Trachea: Trachea normal. No tracheal tenderness or tracheal deviation.  Cardiovascular:     Rate and Rhythm: Normal rate and regular rhythm.     Chest Wall: PMI is not displaced.     Pulses: Normal pulses. No decreased pulses.     Heart sounds: Normal heart  sounds, S1 normal and S2 normal. Heart sounds not distant. No murmur heard.    No systolic murmur is present.     No diastolic murmur is present.     No friction rub. No gallop. No S3 or S4 sounds.  Pulmonary:     Effort: No tachypnea, accessory muscle usage or respiratory distress.     Breath sounds: No stridor. No decreased breath sounds, wheezing, rhonchi or rales.  Chest:     Chest wall: No tenderness.  Abdominal:     General: Bowel sounds are normal. There is no distension.     Palpations: Abdomen is soft. Abdomen is not rigid. There is no mass.     Tenderness: There is abdominal tenderness. There is no right CVA tenderness, left CVA tenderness, guarding or rebound.     Hernia: No hernia is present.     Comments: Bilateral lower abdominal tenderness without rebound or guarding no masses  Musculoskeletal:        General: Normal range of motion.     Cervical back: Normal range of motion and neck supple. No edema, erythema or rigidity. No muscular tenderness. Normal range of motion.  Lymphadenopathy:     Head:     Right side of head: No submental or submandibular adenopathy.     Left side of head: No submental or submandibular adenopathy.     Cervical: No cervical adenopathy.  Skin:    General: Skin is warm and dry.     Coloration: Skin is not pale.     Findings: No rash.     Nails: There is no clubbing.  Neurological:     General: No focal deficit present.     Mental Status: She is alert and oriented to person, place, and time.     Sensory: No sensory deficit.  Psychiatric:        Mood and Affect: Mood normal.        Speech: Speech normal.        Behavior: Behavior normal.        Thought Content: Thought content normal.  Judgment: Judgment normal.     06/30/20  CT chest  IMPRESSION: 1. Negative for acute pulmonary embolus. 2. Positive for segmental Bronchiectasis in the medial left upper lobe with distal airway opacification and segmental lung collapse. Legrand Rams  this as the etiology of bloody sputum. Additional lobar gas trapping. No pleural effusion or other pulmonary abnormality. 3. Indeterminate appearance of the visible left renal upper pole. Cannot exclude exophytic soft tissue mass, although hyperdense benign cyst is possible. This would best be characterized with a routine outpatient Abdomen MRI (renal mass protocol without and with contrast).  08/18/21 CT Chest: IMPRESSION: 1. Stable, definitively benign small pulmonary nodules measuring 0.4 cm and smaller, for which no further follow-up or characterization is required. 2. Unchanged paramedian scarring and bronchiectasis of the suprahilar left upper lobe. 3. Mosaic attenuation of the airspaces, suggesting small airways disease 4. Cholelithiasis.   Follow-up MRI June 2022 IMPRESSION: 1. Lobular contour of the left kidney without suspicious renal lesion visualized. 2. Benign Bosniak classification 1 right renal cyst. 3. Cholelithiasis without evidence of acute cholecystitis.   Assessment & Plan:   Problem List Items Addressed This Visit       Respiratory   Bronchiectasis (HCC)    Stable at this time monitor        Other   Prediabetes    Reassess metabolic panel      Low HDL (under 40)    Reassess lipids      Breast calcification seen on mammogram    Rescreen in 1 year      Lung nodule, solitary    Nodule scanned in June showed to be benign no further imaging needed      Bilateral lower abdominal pain - Primary    Bilateral lower abdominal pain need to rule out gynecologic causes  Plan to obtain transvaginal abdominal ultrasound obtain metabolic panel and blood counts      Relevant Orders   US Pelvic Complete With Transvaginal   Comprehensive metabolic panel   CBC with Differential/Platelet   Urinalysis   RESOLVED: Abnormal mammogram of left breast    Mammogram showed calcifications only in the left breast rescreen breast in 1 year      Other Visit  Diagnoses     Encounter for health-related screening       Relevant Orders   Lipid panel   Hemoglobin A1c      Outpatient Encounter Medications as of 12/19/2021  Medication Sig   meloxicam (MOBIC) 15 MG tablet Take 1 tablet (15 mg total) by mouth daily.   methocarbamol (ROBAXIN) 500 MG tablet Take 2 tablets (1,000 mg total) by mouth every 8 (eight) hours as needed for muscle spasms.   [DISCONTINUED] erythromycin ophthalmic ointment Place a 1/2 inch ribbon of ointment into the lower eyelid.   [DISCONTINUED] hydroxypropyl methylcellulose / hypromellose (ISOPTO TEARS / GONIOVISC) 2.5 % ophthalmic solution Place 2 drops into both eyes 4 (four) times daily.   No facility-administered encounter medications on file as of 12/19/2021.  38 minutes spent in this exam extra time needed because of language barrier Follow-up: Return in about 3 months (around 03/21/2022) for chronic conditions.   Shan Levans, MD

## 2021-12-19 ENCOUNTER — Ambulatory Visit: Payer: BC Managed Care – PPO | Attending: Critical Care Medicine | Admitting: Critical Care Medicine

## 2021-12-19 ENCOUNTER — Encounter: Payer: Self-pay | Admitting: Critical Care Medicine

## 2021-12-19 VITALS — BP 108/78 | HR 88 | Ht 66.0 in | Wt 194.0 lb

## 2021-12-19 DIAGNOSIS — R911 Solitary pulmonary nodule: Secondary | ICD-10-CM | POA: Diagnosis not present

## 2021-12-19 DIAGNOSIS — R1032 Left lower quadrant pain: Secondary | ICD-10-CM

## 2021-12-19 DIAGNOSIS — E786 Lipoprotein deficiency: Secondary | ICD-10-CM

## 2021-12-19 DIAGNOSIS — Z23 Encounter for immunization: Secondary | ICD-10-CM | POA: Diagnosis not present

## 2021-12-19 DIAGNOSIS — R1031 Right lower quadrant pain: Secondary | ICD-10-CM | POA: Diagnosis not present

## 2021-12-19 DIAGNOSIS — J479 Bronchiectasis, uncomplicated: Secondary | ICD-10-CM

## 2021-12-19 DIAGNOSIS — R921 Mammographic calcification found on diagnostic imaging of breast: Secondary | ICD-10-CM

## 2021-12-19 DIAGNOSIS — R928 Other abnormal and inconclusive findings on diagnostic imaging of breast: Secondary | ICD-10-CM

## 2021-12-19 DIAGNOSIS — Z139 Encounter for screening, unspecified: Secondary | ICD-10-CM

## 2021-12-19 DIAGNOSIS — R7303 Prediabetes: Secondary | ICD-10-CM

## 2021-12-19 HISTORY — DX: Right lower quadrant pain: R10.32

## 2021-12-19 HISTORY — DX: Right lower quadrant pain: R10.31

## 2021-12-19 NOTE — Assessment & Plan Note (Signed)
Nodule scanned in June showed to be benign no further imaging needed

## 2021-12-19 NOTE — Assessment & Plan Note (Signed)
Bilateral lower abdominal pain need to rule out gynecologic causes  Plan to obtain transvaginal abdominal ultrasound obtain metabolic panel and blood counts

## 2021-12-19 NOTE — Patient Instructions (Signed)
Flu vaccine was given  Transvaginal ultrasound will be obtained of abdomen  Complete health screening labs obtained at this visit  No new medications are prescribed  We will call you results  Return to Dr. Joya Gaskins 3 months    Le vaccin contre la grippe a t Corporate treasurer chographie transvaginale sera obtenue de l'abdomen  Laboratoires de dpistage de sant complets obtenus lors de cette visite  Aucun nouveau mdicament n'est prescrit  Nous vous appellerons les rsultats  Retour chez le Dr Joya Gaskins 3 mois

## 2021-12-19 NOTE — Assessment & Plan Note (Signed)
Reassess lipids  

## 2021-12-19 NOTE — Assessment & Plan Note (Signed)
Rescreen in 1 year

## 2021-12-19 NOTE — Assessment & Plan Note (Signed)
Reassess metabolic panel 

## 2021-12-19 NOTE — Assessment & Plan Note (Signed)
Mammogram showed calcifications only in the left breast rescreen breast in 1 year

## 2021-12-19 NOTE — Assessment & Plan Note (Signed)
Stable at this time monitor 

## 2021-12-20 ENCOUNTER — Other Ambulatory Visit: Payer: Self-pay | Admitting: Critical Care Medicine

## 2021-12-20 DIAGNOSIS — R7303 Prediabetes: Secondary | ICD-10-CM

## 2021-12-20 LAB — URINALYSIS
Bilirubin, UA: NEGATIVE
Glucose, UA: NEGATIVE
Ketones, UA: NEGATIVE
Leukocytes,UA: NEGATIVE
Nitrite, UA: NEGATIVE
Protein,UA: NEGATIVE
RBC, UA: NEGATIVE
Specific Gravity, UA: 1.012 (ref 1.005–1.030)
Urobilinogen, Ur: 0.2 mg/dL (ref 0.2–1.0)
pH, UA: 6.5 (ref 5.0–7.5)

## 2021-12-20 LAB — LIPID PANEL
Chol/HDL Ratio: 4.1 ratio (ref 0.0–4.4)
Cholesterol, Total: 163 mg/dL (ref 100–199)
HDL: 40 mg/dL (ref >39)
LDL Chol Calc (NIH): 110 mg/dL — ABNORMAL HIGH (ref 0–99)
Triglycerides: 69 mg/dL (ref 0–149)
VLDL Cholesterol Cal: 13 mg/dL (ref 5–40)

## 2021-12-20 LAB — HEMOGLOBIN A1C
Est. average glucose Bld gHb Est-mCnc: 126 mg/dL
Hgb A1c MFr Bld: 6 % — ABNORMAL HIGH (ref 4.8–5.6)

## 2021-12-20 LAB — CBC WITH DIFFERENTIAL/PLATELET
Basophils Absolute: 0 10*3/uL (ref 0.0–0.2)
Basos: 1 %
EOS (ABSOLUTE): 0.1 10*3/uL (ref 0.0–0.4)
Eos: 1 %
Hematocrit: 35.5 % (ref 34.0–46.6)
Hemoglobin: 11.5 g/dL (ref 11.1–15.9)
Immature Grans (Abs): 0 10*3/uL (ref 0.0–0.1)
Immature Granulocytes: 0 %
Lymphocytes Absolute: 3.4 10*3/uL — ABNORMAL HIGH (ref 0.7–3.1)
Lymphs: 48 %
MCH: 25 pg — ABNORMAL LOW (ref 26.6–33.0)
MCHC: 32.4 g/dL (ref 31.5–35.7)
MCV: 77 fL — ABNORMAL LOW (ref 79–97)
Monocytes Absolute: 0.5 10*3/uL (ref 0.1–0.9)
Monocytes: 7 %
Neutrophils Absolute: 3 10*3/uL (ref 1.4–7.0)
Neutrophils: 43 %
Platelets: 240 10*3/uL (ref 150–450)
RBC: 4.6 x10E6/uL (ref 3.77–5.28)
RDW: 14.3 % (ref 11.7–15.4)
WBC: 7 10*3/uL (ref 3.4–10.8)

## 2021-12-20 LAB — COMPREHENSIVE METABOLIC PANEL
ALT: 10 IU/L (ref 0–32)
AST: 14 IU/L (ref 0–40)
Albumin/Globulin Ratio: 1.4 (ref 1.2–2.2)
Albumin: 4.6 g/dL (ref 3.9–4.9)
Alkaline Phosphatase: 78 IU/L (ref 44–121)
BUN/Creatinine Ratio: 15 (ref 9–23)
BUN: 13 mg/dL (ref 6–24)
Bilirubin Total: 0.4 mg/dL (ref 0.0–1.2)
CO2: 25 mmol/L (ref 20–29)
Calcium: 9.3 mg/dL (ref 8.7–10.2)
Chloride: 101 mmol/L (ref 96–106)
Creatinine, Ser: 0.84 mg/dL (ref 0.57–1.00)
Globulin, Total: 3.2 g/dL (ref 1.5–4.5)
Glucose: 68 mg/dL — ABNORMAL LOW (ref 70–99)
Potassium: 4 mmol/L (ref 3.5–5.2)
Sodium: 138 mmol/L (ref 134–144)
Total Protein: 7.8 g/dL (ref 6.0–8.5)
eGFR: 88 mL/min/{1.73_m2} (ref >59)

## 2021-12-20 NOTE — Progress Notes (Signed)
Let pt know no diabetes, liver kidney normal, blood count normal, cholesterol high focus on plant based diet and low fat  A1C suggests prediabetes again focus on low carb, plant based diet

## 2021-12-25 ENCOUNTER — Telehealth: Payer: Self-pay

## 2021-12-25 NOTE — Telephone Encounter (Signed)
Pt was called and is aware of results, DOB was confirmed.   Interpreter id# [phone]

## 2021-12-25 NOTE — Telephone Encounter (Signed)
-----   Message from Elsie Stain, MD sent at 12/20/2021  5:53 AM EDT ----- Let pt know no diabetes, liver kidney normal, blood count normal, cholesterol high focus on plant based diet and low fat  A1C suggests prediabetes again focus on low carb, plant based diet

## 2022-01-19 ENCOUNTER — Other Ambulatory Visit: Payer: BC Managed Care – PPO

## 2022-01-26 ENCOUNTER — Ambulatory Visit
Admission: RE | Admit: 2022-01-26 | Discharge: 2022-01-26 | Disposition: A | Discharge status: Home / Self Care | Payer: BC Managed Care – PPO | Source: Ambulatory Visit | Attending: Critical Care Medicine | Admitting: Critical Care Medicine

## 2022-01-26 DIAGNOSIS — R1031 Right lower quadrant pain: Secondary | ICD-10-CM

## 2022-01-27 NOTE — Progress Notes (Signed)
Let pt know there are small fibroids in uterus not enough to cause pain in abdomen  no other abnormality

## 2022-02-02 ENCOUNTER — Telehealth: Payer: Self-pay

## 2022-02-02 NOTE — Telephone Encounter (Signed)
Pt was called and is aware of results, DOB was confirmed.  Interpreter [phone]

## 2022-02-02 NOTE — Telephone Encounter (Signed)
-----   Message from Storm Frisk, MD sent at 01/27/2022  8:36 AM EST ----- Let pt know there are small fibroids in uterus not enough to cause pain in abdomen  no other abnormality

## 2022-03-27 ENCOUNTER — Ambulatory Visit: Payer: Self-pay | Admitting: Critical Care Medicine

## 2022-05-10 IMAGING — MR MR ABDOMEN WO/W CM
19 series · 48 of 48 positions shown · IV contrast (gadavist)
Comparison: CT chest July 01, 2020.

CLINICAL DATA: Further characterization of possible left renal mass
seen on prior CT chest

EXAM:
MRI ABDOMEN WITHOUT AND WITH CONTRAST
TECHNIQUE: Multiplanar multisequence MR imaging of the abdomen was performed
both before and after the administration of intravenous contrast.
CONTRAST:  9mL GADAVIST GADOBUTROL 1 MMOL/ML IV SOLN

[Series 4: cor haste · coronal · 6.0mm · 1.19mm/px · 1 of 34 slices shown]
[im 1/34]
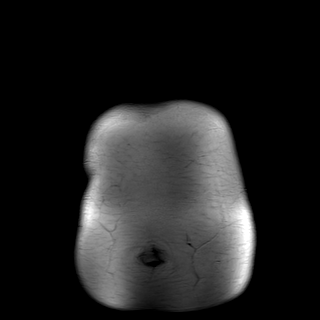

[Series 5: ax haste · axial · 6.0mm · 1.25mm/px · 1 of 36 slices shown]
[im 1/36]
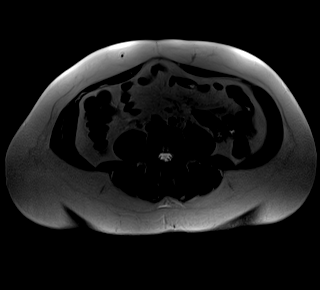

[Series 6: t1_vibe_opp-in_tra_p4_bh · axial · 3.0mm · 1.25mm/px · z∈[-208,+29]mm · 3 of 80 slices shown (1 of 2)]
[im 1/80]
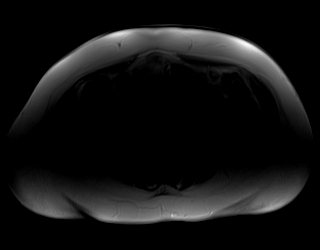
[im 40/80]
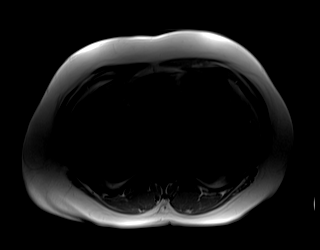
[im 80/80]
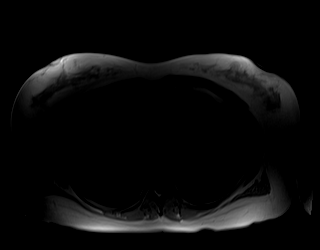

[Series 6: t1_vibe_opp-in_tra_p4_bh · axial · 3.0mm · 1.25mm/px · z∈[-208,+29]mm · 3 of 80 slices shown (2 of 2)]
[im 1/80]
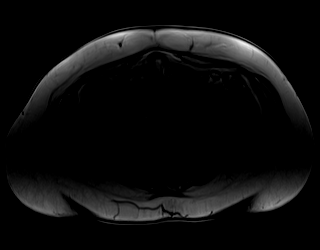
[im 40/80]
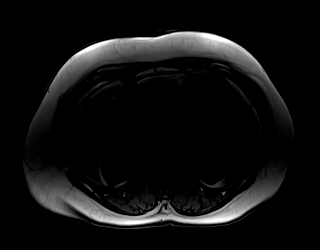
[im 80/80]
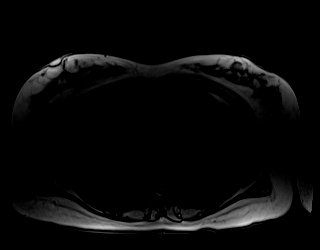

[Series 7: DWI · axial · 6.0mm · 1.49mm/px · z∈[-216,+22]mm · 4 of 102 slices shown (1 of 2)]
[im 1/102]
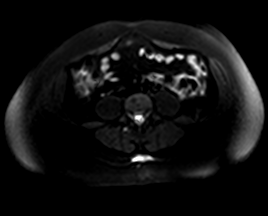
[im 34/102]
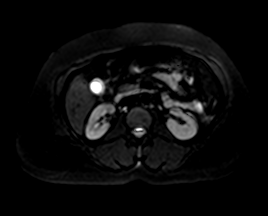
[im 68/102]
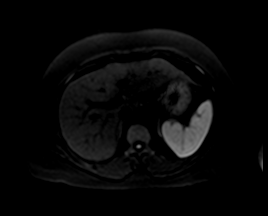
[im 102/102]
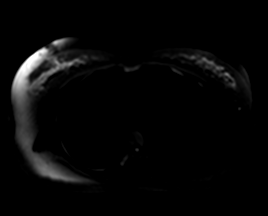

[Series 8: DWI · axial · 6.0mm · 1.49mm/px · 1 of 34 slices shown (2 of 2)]
[im 1/34]
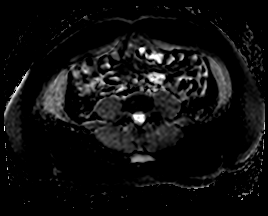

[Series 9: bSSFP · axial · 6.0mm · 0.78mm/px · 1 of 36 slices shown]
[im 1/36]
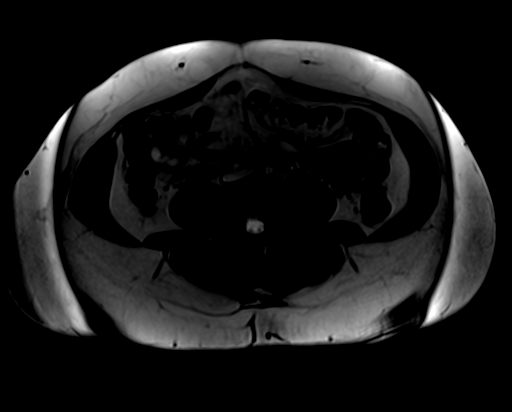

[Series 12: T2 fat-sat · axial · 6.0mm · 1.19mm/px · 1 of 36 slices shown]
[im 1/36]
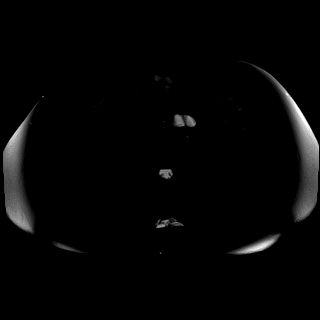

[Series 15: t1_vibe_fs_tra_p4_bh_pre · axial · 3.0mm · 1.39mm/px · z∈[-220,+41]mm · 3 of 88 slices shown]
[im 1/88]
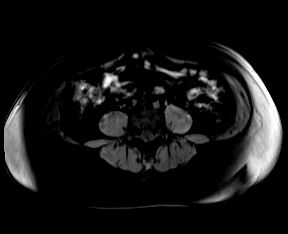
[im 44/88]
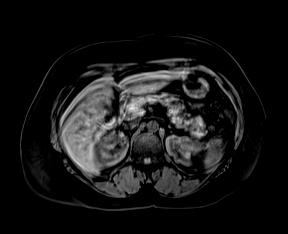
[im 88/88]
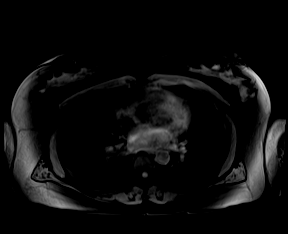

[Series 17: t1_vibe_fs_tra_p4_bh_post · axial · 3.0mm · 1.39mm/px · z∈[-220,+41]mm · 3 of 88 slices shown (1 of 4)]
[im 1/88]
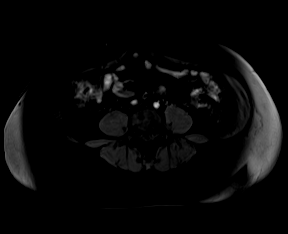
[im 44/88]
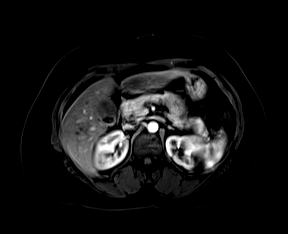
[im 88/88]
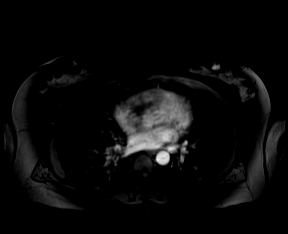

[Series 18: t1_vibe_fs_tra_p4_bh_post · axial · 3.0mm · 1.39mm/px · z∈[-220,+41]mm · 3 of 88 slices shown (2 of 4)]
[im 1/88]
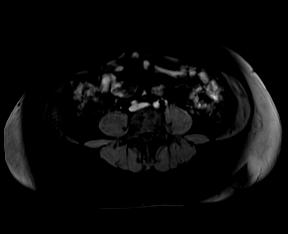
[im 44/88]
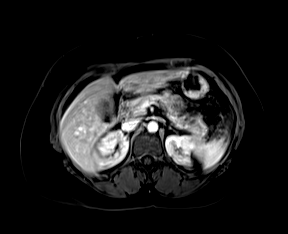
[im 88/88]
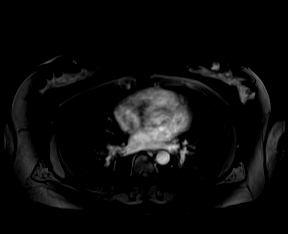

[Series 19: t1_vibe_fs_tra_p4_bh_post · axial · 3.0mm · 1.39mm/px · z∈[-220,+41]mm · 3 of 88 slices shown (3 of 4)]
[im 1/88]
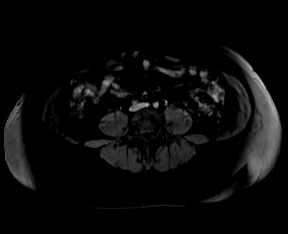
[im 44/88]
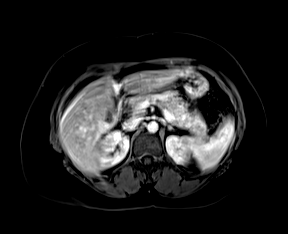
[im 88/88]
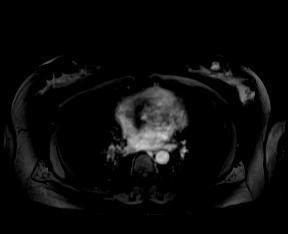

[Series 20: t1_vibe_fs_tra_p4_bh_post · axial · 3.0mm · 1.39mm/px · z∈[-220,+41]mm · 3 of 88 slices shown (4 of 4)]
[im 1/88]
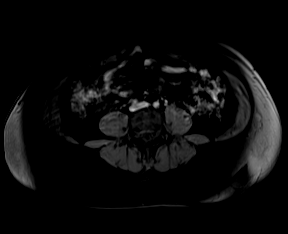
[im 44/88]
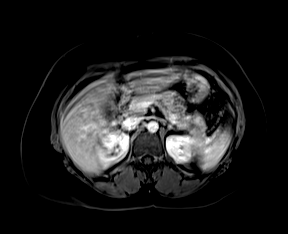
[im 88/88]
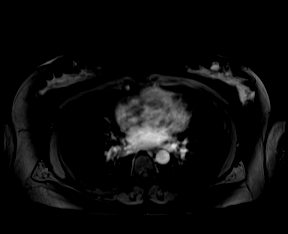

[Series 21: T1 dynamic post-contrast · coronal · 3.0mm · 1.39mm/px · 3 of 72 slices shown (1 of 2)]
[im 1/72]
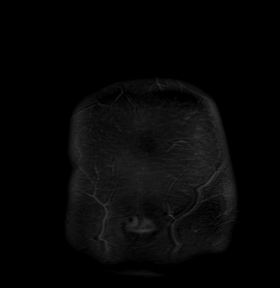
[im 36/72]
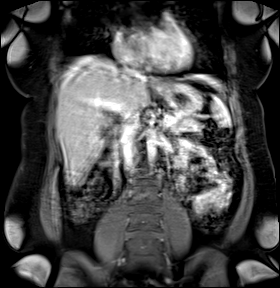
[im 72/72]
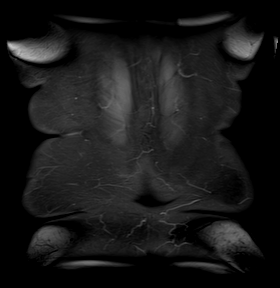

[Series 22: T1 dynamic post-contrast · coronal · 3.0mm · 1.39mm/px · 3 of 72 slices shown (2 of 2)]
[im 1/72]
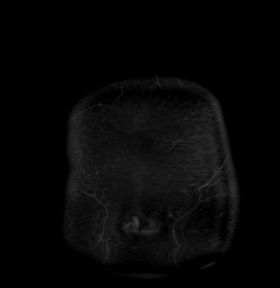
[im 36/72]
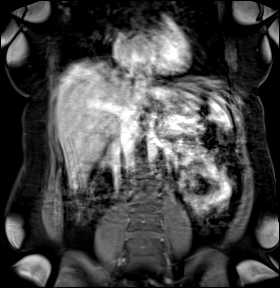
[im 72/72]
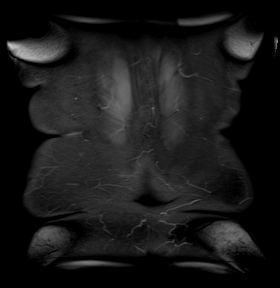

[Series 1023: results sub_p1_t1_vibe_fs_tra_p4_bh_post · axial · 3.0mm · 1.39mm/px · z∈[-220,+41]mm · 3 of 88 slices shown (1 of 4)]
[im 1/88]
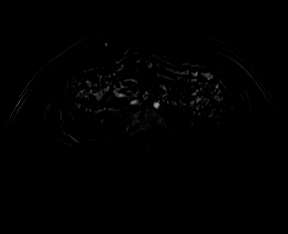
[im 44/88]
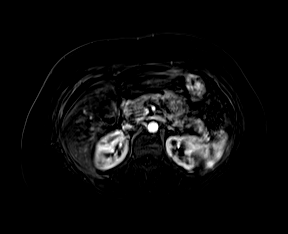
[im 88/88]
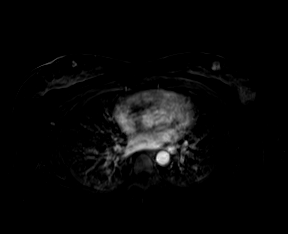

[Series 1025: results sub_p1_t1_vibe_fs_tra_p4_bh_post · axial · 3.0mm · 1.39mm/px · z∈[-220,+41]mm · 3 of 88 slices shown (2 of 4)]
[im 1/88]
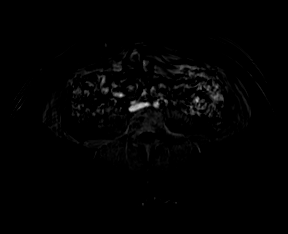
[im 44/88]
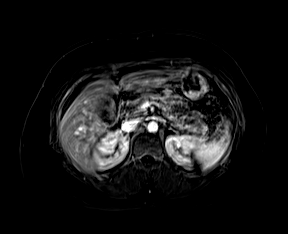
[im 88/88]
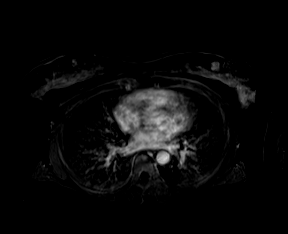

[Series 1027: results sub_p1_t1_vibe_fs_tra_p4_bh_post · axial · 3.0mm · 1.39mm/px · z∈[-220,+41]mm · 3 of 88 slices shown (3 of 4)]
[im 1/88]
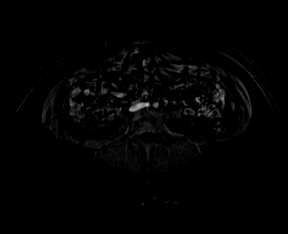
[im 44/88]
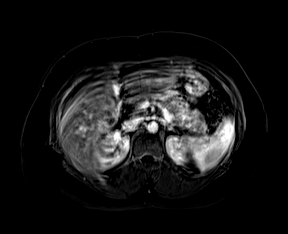
[im 88/88]
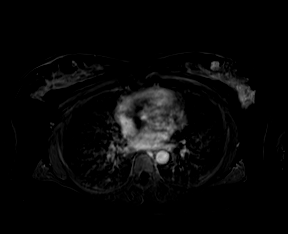

[Series 1029: results sub_p1_t1_vibe_fs_tra_p4_bh_post · axial · 3.0mm · 1.39mm/px · z∈[-220,+41]mm · 3 of 88 slices shown (4 of 4)]
[im 1/88]
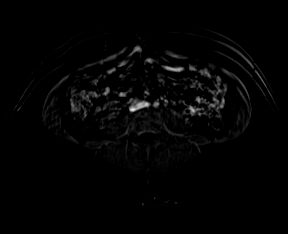
[im 44/88]
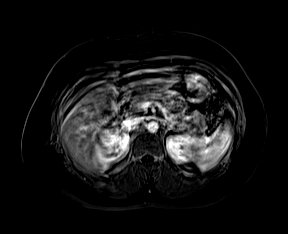
[im 88/88]
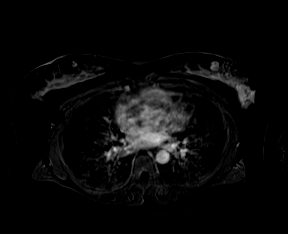

[48 of 48 positions shown; findings below may reference images not displayed]

FINDINGS: Lower chest: No acute abnormality.

Hepatobiliary: No hepatic steatosis. No suspicious hepatic lesion.
Cholelithiasis without evidence of acute cholecystitis. No biliary
ductal dilation.

Pancreas: No mass, inflammatory changes, or other parenchymal
abnormality identified.

Spleen:  Within normal limits in size and appearance.

Adrenals/Urinary Tract:  Bilateral adrenal glands are unremarkable.

No hydronephrosis. Bosniak classification 1 benign renal cyst in the
lower pole the right kidney measuring 3.3 cm on image [DATE]. Lobular
contour of the left kidney without discrete solid enhancing renal
lesion visualized.

Stomach/Bowel: Visualized portions within the abdomen are
unremarkable.

Vascular/Lymphatic: No pathologically enlarged lymph nodes
identified. No abdominal aortic aneurysm demonstrated.

Other:  No abdominal ascites.

Musculoskeletal: No suspicious bone lesions identified.
IMPRESSION: 1. Lobular contour of the left kidney without suspicious renal
lesion visualized.
2. Benign Bosniak classification 1 right renal cyst.
3. Cholelithiasis without evidence of acute cholecystitis.

## 2022-05-31 ENCOUNTER — Ambulatory Visit: Payer: BC Managed Care – PPO | Admitting: Critical Care Medicine

## 2022-06-19 ENCOUNTER — Ambulatory Visit: Payer: BC Managed Care – PPO | Admitting: Critical Care Medicine

## 2022-08-27 NOTE — Progress Notes (Unsigned)
New Patient Office Visit  Subjective:  Patient ID: Amber Alvarado, female    DOB: August 20, 1980  Age: 42 y.o. MRN: [id number]  CC:  No chief complaint on file.   HPI Amber Alvarado presents for establish primary care.  Patient was seen previously in June by PA [place].  Documentation is as below  07/18/21  Saw [name] 08/2020 Amber Alvarado is a 42 y.o. female here today to establish care and for a follow up visit After ED visit 06/30/2020 for blood in her saliva.  This has resolved completely.  No SOB.  No cough.  No CP.     Incidentally, she was found to have a renal mass and needs follow up MRI.  No urinary issues.  No N/V/D.  Appetite is good.     H/o heavy periods and anemia.  Not on iron.  Hgb =10.5 at ED.     From ED note:   Patient here with intermittent spots of blood in her saliva and mucus.    Chest x-ray in triage shows no acute abnormalities. Screening labs will be obtained to evaluate for thrombocytopenia.   Hemoglobin slightly decreased at 10.5 from 12 2 years ago.  Platelet count is stable.  D-dimer is positive.   CT scan shows no pulmonary embolism but does show bronchiectasis in the left upper lobe with distal airway opacification.  Patient informed of left renal mass and need for MRI follow-up.   D/w Dr. Aldean Ast.  Patient is stable.  She can follow-up in the office.  He does agree with Levaquin 750 mg x 5 days.  Return to the ED if symptoms worsen. Follow up with PCP and pulmonary. Patient aware of need for MRI of renal finding.  No hypoxia or increased work of breathing. No witnessed hemoptysis in the ED.  Return precautions discussed.     ED/Hospital notes reviewed.   Social History:  From [place] but in [place] x 7 years   1. Iron deficiency anemia due to chronic blood loss Start iron sulfate 325 mg daily   2. Renal mass - MR Abdomen W Wo Contrast; Future - Basic metabolic panel   3. Other specified disorders of kidney and  ureter - MR Abdomen W Wo Contrast; Future - Basic metabolic panel   4. Language barrier [facility]) interpreters used and additional time performing visit was required.     5. Encounter for examination following treatment at hospital Cough and blood in sputum resolved.  Saw pulmonary 07/26/2020-incomplete note in Epic as of today's date  Note language barrier at today's visit is assisted by in person interpreter Joni Reining for [place] The patient's had no further hemoptysis since that visit.  She did see pulmonary and they detected no abnormalities she had finished her course of Levaquin and they recommended repeat CT scan of the chest in 6 months from the visit in May which would be in November of this year.  Note patient has had previous menorrhagia with normal cycle with iron deficiency document [place] by her gynecologist who is in [facility].  She is due another Pap smear in January and wishes to return to that gynecologist.  Also she had diagnostic mammograms done in February of this year in the [facility] center.  She has calcifications in both breast they recommended repeat imaging in 6 months in August she is yet to have these imagings performed.  She is due a flu vaccine and she will accept this.  The only  medications he is currently taking his iron supplement.  She is about out of this medication and we will have her run out of this as we will assess her lab data at this visit.  She has no other complaints at this time.  She does work doing Lobbyist work.  She immigrated from [place] 7 years ago.  She had a tetanus shot before she immigrated.   10/3 Patient seen in return follow-up visit assisted with 6 [place] interpreter Georgeann Oppenheim [phone] Patient complains today of bilateral lower abdominal pain but no change in menstrual cycle vaginal discharge dysuria or other symptoms referable to the lower abdomen.  She denies any change in bowel habits nausea or vomiting.  Pains been going  on for several weeks.  Note at the last visit we scanned her chest and showed stable lung nodules likely benign.  She is no longer having any hemoptysis or cough.  She did agree and receive the flu vaccine at this visit.  Blood pressure on arrival is good 108/78 There are no other complaints.   08/30/22  Past Medical History:  Diagnosis Date   Iron deficiency anemia due to chronic blood loss 08/07/2017   Microcytosis 08/07/2017    No past surgical history on file.  Family History  Problem Relation Age of Onset   Diabetes Father     Social History   Socioeconomic History   Marital status: Married    Spouse name: Not on file   Number of children: Not on file   Years of education: Not on file   Highest education level: Not on file  Occupational History   Not on file  Tobacco Use   Smoking status: Never   Smokeless tobacco: Never  Substance and Sexual Activity   Alcohol use: Not on file   Drug use: Not on file   Sexual activity: Yes  Other Topics Concern   Not on file  Social History Narrative   Not on file   Social Determinants of Health   Financial Resource Strain: Not on file  Food Insecurity: Not on file  Transportation Needs: Not on file  Physical Activity: Not on file  Stress: Not on file  Social Connections: Not on file  Intimate Partner Violence: Not on file    ROS Review of Systems  Constitutional:  Negative for chills, diaphoresis and fever.  HENT:  Negative for congestion, ear discharge, ear pain, hearing loss, nosebleeds, sore throat and tinnitus.   Eyes:  Negative for photophobia and discharge.  Respiratory: Negative.  Negative for cough, shortness of breath, wheezing and stridor.        No excess mucus  Cardiovascular:  Negative for chest pain, palpitations and leg swelling.  Gastrointestinal:  Positive for abdominal pain. Negative for abdominal distention, anal bleeding, blood in stool, constipation, diarrhea, nausea, rectal pain and vomiting.   Endocrine: Negative for polydipsia.  Genitourinary:  Negative for decreased urine volume, difficulty urinating, dyspareunia, dysuria, enuresis, flank pain, frequency, hematuria, menstrual problem, pelvic pain, urgency, vaginal bleeding, vaginal discharge and vaginal pain.  Musculoskeletal:  Negative for back pain, myalgias and neck pain.  Skin:  Negative for rash.  Allergic/Immunologic: Negative for environmental allergies.  Neurological:  Negative for dizziness, tremors, seizures, weakness and headaches.  Hematological:  Does not bruise/bleed easily.  Psychiatric/Behavioral:  Negative for hallucinations and suicidal ideas. The patient is not nervous/anxious.   All other systems reviewed and are negative.   Objective:   Today's Vitals: There were no vitals taken  for this visit.  Physical Exam Vitals reviewed.  Constitutional:      Appearance: Normal appearance. She is well-developed. She is not diaphoretic.  HENT:     Head: Normocephalic and atraumatic.     Nose: Nose normal. No nasal deformity, septal deviation, mucosal edema or rhinorrhea.     Right Sinus: No maxillary sinus tenderness or frontal sinus tenderness.     Left Sinus: No maxillary sinus tenderness or frontal sinus tenderness.     Mouth/Throat:     Mouth: Mucous membranes are moist.     Pharynx: Oropharynx is clear. No oropharyngeal exudate.  Eyes:     General: No scleral icterus.    Conjunctiva/sclera: Conjunctivae normal.     Pupils: Pupils are equal, round, and reactive to light.  Neck:     Thyroid: No thyromegaly.     Vascular: No carotid bruit or JVD.     Trachea: Trachea normal. No tracheal tenderness or tracheal deviation.  Cardiovascular:     Rate and Rhythm: Normal rate and regular rhythm.     Chest Wall: PMI is not displaced.     Pulses: Normal pulses. No decreased pulses.     Heart sounds: Normal heart sounds, S1 normal and S2 normal. Heart sounds not distant. No murmur heard.    No systolic murmur is  present.     No diastolic murmur is present.     No friction rub. No gallop. No S3 or S4 sounds.  Pulmonary:     Effort: No tachypnea, accessory muscle usage or respiratory distress.     Breath sounds: No stridor. No decreased breath sounds, wheezing, rhonchi or rales.  Chest:     Chest wall: No tenderness.  Abdominal:     General: Bowel sounds are normal. There is no distension.     Palpations: Abdomen is soft. Abdomen is not rigid. There is no mass.     Tenderness: There is abdominal tenderness. There is no right CVA tenderness, left CVA tenderness, guarding or rebound.     Hernia: No hernia is present.     Comments: Bilateral lower abdominal tenderness without rebound or guarding no masses  Musculoskeletal:        General: Normal range of motion.     Cervical back: Normal range of motion and neck supple. No edema, erythema or rigidity. No muscular tenderness. Normal range of motion.  Lymphadenopathy:     Head:     Right side of head: No submental or submandibular adenopathy.     Left side of head: No submental or submandibular adenopathy.     Cervical: No cervical adenopathy.  Skin:    General: Skin is warm and dry.     Coloration: Skin is not pale.     Findings: No rash.     Nails: There is no clubbing.  Neurological:     General: No focal deficit present.     Mental Status: She is alert and oriented to person, place, and time.     Sensory: No sensory deficit.  Psychiatric:        Mood and Affect: Mood normal.        Speech: Speech normal.        Behavior: Behavior normal.        Thought Content: Thought content normal.        Judgment: Judgment normal.     06/30/20  CT chest  IMPRESSION: 1. Negative for acute pulmonary embolus. 2. Positive for segmental Bronchiectasis in the medial  left upper lobe with distal airway opacification and segmental lung collapse. Legrand Rams this as the etiology of bloody sputum. Additional lobar gas trapping. No pleural effusion or other  pulmonary abnormality. 3. Indeterminate appearance of the visible left renal upper pole. Cannot exclude exophytic soft tissue mass, although hyperdense benign cyst is possible. This would best be characterized with a routine outpatient Abdomen MRI (renal mass protocol without and with contrast).  08/18/21 CT Chest: IMPRESSION: 1. Stable, definitively benign small pulmonary nodules measuring 0.4 cm and smaller, for which no further follow-up or characterization is required. 2. Unchanged paramedian scarring and bronchiectasis of the suprahilar left upper lobe. 3. Mosaic attenuation of the airspaces, suggesting small airways disease 4. Cholelithiasis.   Follow-up MRI June 2022 IMPRESSION: 1. Lobular contour of the left kidney without suspicious renal lesion visualized. 2. Benign Bosniak classification 1 right renal cyst. 3. Cholelithiasis without evidence of acute cholecystitis.   Assessment & Plan:   Problem List Items Addressed This Visit   None Outpatient Encounter Medications as of 08/30/2022  Medication Sig   meloxicam (MOBIC) 15 MG tablet Take 1 tablet (15 mg total) by mouth daily.   methocarbamol (ROBAXIN) 500 MG tablet Take 2 tablets (1,000 mg total) by mouth every 8 (eight) hours as needed for muscle spasms.   No facility-administered encounter medications on file as of 08/30/2022.  38 minutes spent in this exam extra time needed because of language barrier Follow-up: No follow-ups on file.   Shan Levans, MD

## 2022-08-30 ENCOUNTER — Ambulatory Visit: Payer: BC Managed Care – PPO | Attending: Critical Care Medicine | Admitting: Critical Care Medicine

## 2022-08-30 ENCOUNTER — Encounter: Payer: Self-pay | Admitting: Critical Care Medicine

## 2022-08-30 VITALS — BP 105/72 | HR 79 | Temp 98.4°F | Ht 66.0 in | Wt 199.0 lb

## 2022-08-30 DIAGNOSIS — R1031 Right lower quadrant pain: Secondary | ICD-10-CM

## 2022-08-30 DIAGNOSIS — R7303 Prediabetes: Secondary | ICD-10-CM | POA: Diagnosis not present

## 2022-08-30 DIAGNOSIS — J479 Bronchiectasis, uncomplicated: Secondary | ICD-10-CM

## 2022-08-30 DIAGNOSIS — R911 Solitary pulmonary nodule: Secondary | ICD-10-CM

## 2022-08-30 DIAGNOSIS — R1032 Left lower quadrant pain: Secondary | ICD-10-CM

## 2022-08-30 LAB — POCT GLYCOSYLATED HEMOGLOBIN (HGB A1C): HbA1c, POC (controlled diabetic range): 6 % (ref 0.0–7.0)

## 2022-08-30 LAB — GLUCOSE, POCT (MANUAL RESULT ENTRY): POC Glucose: 85 mg/dl (ref 70–99)

## 2022-08-30 NOTE — Assessment & Plan Note (Signed)
Benign.

## 2022-08-30 NOTE — Assessment & Plan Note (Signed)
Resolved

## 2022-08-30 NOTE — Assessment & Plan Note (Signed)
No medication needed

## 2022-08-30 NOTE — Patient Instructions (Addendum)
No medications needed Return 6 months , call sooner if needed for any new symptoms/concerns  Aucun mdicament n'est ncessaire Revenez 6 mois, appelez plus tt si ncessaire pour [employer] symptme/proccupation

## 2022-09-10 ENCOUNTER — Other Ambulatory Visit: Payer: Self-pay | Admitting: Critical Care Medicine

## 2022-09-10 DIAGNOSIS — Z1231 Encounter for screening mammogram for malignant neoplasm of breast: Secondary | ICD-10-CM

## 2022-10-09 ENCOUNTER — Other Ambulatory Visit: Payer: Self-pay | Admitting: Critical Care Medicine

## 2022-10-09 DIAGNOSIS — R921 Mammographic calcification found on diagnostic imaging of breast: Secondary | ICD-10-CM

## 2022-10-12 ENCOUNTER — Ambulatory Visit: Payer: BC Managed Care – PPO

## 2022-10-22 ENCOUNTER — Ambulatory Visit
Admission: RE | Admit: 2022-10-22 | Discharge: 2022-10-22 | Disposition: A | Discharge status: Home / Self Care | Payer: BC Managed Care – PPO | Source: Ambulatory Visit | Attending: Critical Care Medicine | Admitting: Critical Care Medicine

## 2022-10-22 DIAGNOSIS — R921 Mammographic calcification found on diagnostic imaging of breast: Secondary | ICD-10-CM

## 2022-10-23 NOTE — Progress Notes (Signed)
Let pt know mammogram normal  recheck in one year

## 2022-10-24 ENCOUNTER — Telehealth: Payer: Self-pay

## 2022-10-24 NOTE — Telephone Encounter (Signed)
-----   Message from Shan Levans sent at 10/23/2022  3:39 AM EDT ----- Let pt know mammogram normal recheck in one year

## 2022-10-24 NOTE — Telephone Encounter (Signed)
Pt was called and is aware of results, DOB was confirmed.  Interpreter id #[id number]

## 2023-03-05 ENCOUNTER — Ambulatory Visit: Payer: BC Managed Care – PPO | Admitting: Critical Care Medicine

## 2023-03-05 ENCOUNTER — Ambulatory Visit: Payer: BC Managed Care – PPO | Attending: Critical Care Medicine | Admitting: Internal Medicine

## 2023-03-05 VITALS — BP 114/76 | HR 77 | Temp 98.2°F | Ht 66.0 in | Wt 202.0 lb

## 2023-03-05 DIAGNOSIS — R7303 Prediabetes: Secondary | ICD-10-CM | POA: Diagnosis not present

## 2023-03-05 DIAGNOSIS — Z23 Encounter for immunization: Secondary | ICD-10-CM

## 2023-03-05 DIAGNOSIS — E66811 Obesity, class 1: Secondary | ICD-10-CM | POA: Diagnosis not present

## 2023-03-05 DIAGNOSIS — Z862 Personal history of diseases of the blood and blood-forming organs and certain disorders involving the immune mechanism: Secondary | ICD-10-CM | POA: Diagnosis not present

## 2023-03-05 LAB — GLUCOSE, POCT (MANUAL RESULT ENTRY): POC Glucose: 98 mg/dL (ref 70–99)

## 2023-03-05 LAB — POCT GLYCOSYLATED HEMOGLOBIN (HGB A1C): HbA1c, POC (controlled diabetic range): 6.1 % (ref 0.0–7.0)

## 2023-03-05 NOTE — Progress Notes (Signed)
Patient ID: Amber Alvarado, female    DOB: 1981-01-09  MRN: [id number]  CC: Prediabetes (Prediabetes f/u./No questions / concerns/Yes to flu vax.)   Subjective: Amber Alvarado is a 42 y.o. female who presents for chronic ds management.  Previous PCP was Dr. Delford Field Her concerns today include:  History of bronchiectasis, obesity, prediabetes, microcytosis  HM:  yes to flu shot.  PreDM/Obesity Results for orders placed or performed in visit on 03/05/23  POCT glucose (manual entry)   Collection Time: 03/05/23  4:20 PM  Result Value Ref Range   POC Glucose 98 70 - 99 mg/dl  POCT glycosylated hemoglobin (Hb A1C)   Collection Time: 03/05/23  4:28 PM  Result Value Ref Range   Hemoglobin A1C     HbA1c POC (<> result, manual entry)     HbA1c, POC (prediabetic range)     HbA1c, POC (controlled diabetic range) 6.1 0.0 - 7.0 %  Pt states she was not aware of dx of preDM. We discussed her eating habits.  She does eat meat.  She loves pork.  Diet is high in white starch. Pt with hx of IDA on chart.  Has microcytosis with low nl H/H    Patient Active Problem List   Diagnosis Date Noted   Breast calcification seen on mammogram 11/22/2020   Bronchiectasis (HCC) 11/22/2020   BMI 31.0-31.9,adult 11/22/2020   Prediabetes 08/07/2017   Low HDL (under 40) 08/07/2017     No current outpatient medications on file prior to visit.   No current facility-administered medications on file prior to visit.    No Known Allergies  Social History   Socioeconomic History   Marital status: Married    Spouse name: Not on file   Number of children: Not on file   Years of education: Not on file   Highest education level: Not on file  Occupational History   Not on file  Tobacco Use   Smoking status: Never   Smokeless tobacco: Never  Substance and Sexual Activity   Alcohol use: Not on file   Drug use: Not on file   Sexual activity: Yes  Other Topics Concern   Not on file   Social History Narrative   Not on file   Social Drivers of Health   Financial Resource Strain: Medium Risk (03/05/2023)   Overall Financial Resource Strain (CARDIA)    Difficulty of Paying Living Expenses: Somewhat hard  Food Insecurity: No Food Insecurity (03/05/2023)   Hunger Vital Sign    Worried About Running Out of Food in the Last Year: Never true    Ran Out of Food in the Last Year: Never true  Transportation Needs: No Transportation Needs (03/05/2023)   PRAPARE - Administrator, Civil Service (Medical): No    Lack of Transportation (Non-Medical): No  Physical Activity: Inactive (03/05/2023)   Exercise Vital Sign    Days of Exercise per Week: 0 days    Minutes of Exercise per Session: 0 min  Stress: No Stress Concern Present (03/05/2023)   [employer] of Occupational Health - Occupational Stress Questionnaire    Feeling of Stress : Not at all  Social Connections: Moderately Integrated (03/05/2023)   Social Connection and Isolation Panel [NHANES]    Frequency of Communication with Friends and Family: More than three times a week    Frequency of Social Gatherings with Friends and Family: More than three times a week    Attends Religious Services: More than 4  times per year    Active Member of Clubs or Organizations: No    Attends Banker Meetings: Never    Marital Status: Married  Catering manager Violence: Not At Risk (03/05/2023)   Humiliation, Afraid, Rape, and Kick questionnaire    Fear of Current or Ex-Partner: No    Emotionally Abused: No    Physically Abused: No    Sexually Abused: No    Family History  Problem Relation Age of Onset   Diabetes Father     No past surgical history on file.  ROS: Review of Systems Negative except as stated above  PHYSICAL EXAM: BP 114/76 (BP Location: Left Arm, Patient Position: Sitting, Cuff Size: Normal)   Pulse 77   Temp 98.2 F (36.8 C) (Oral)   Ht 5\' 6"  (1.676 m)   Wt 202 lb (91.6  kg)   SpO2 100%   BMI 32.60 kg/m   Wt Readings from Last 3 Encounters:  03/05/23 202 lb (91.6 kg)  08/30/22 199 lb (90.3 kg)  12/19/21 194 lb (88 kg)    Physical Exam  General appearance - alert, well appearing, and in no distress Mental status - normal mood, behavior, speech, dress, motor activity, and thought processes Chest - clear to auscultation, no wheezes, rales or rhonchi, symmetric air entry Heart - normal rate, regular rhythm, normal S1, S2, no murmurs, rubs, clicks or gallops Extremities - peripheral pulses normal, no pedal edema, no clubbing or cyanosis      Latest Ref Rng & Units 12/19/2021    4:17 PM 11/22/2020    4:05 PM 08/17/2020   11:15 AM  CMP  Glucose 70 - 99 mg/dL 68  77  86   BUN 6 - 24 mg/dL 13  12  10    Creatinine 0.57 - 1.00 mg/dL 4.09  8.11  9.14   Sodium 134 - 144 mmol/L 138  141  139   Potassium 3.5 - 5.2 mmol/L 4.0  4.6  4.8   Chloride 96 - 106 mmol/L 101  104  105   CO2 20 - 29 mmol/L 25  26  23    Calcium 8.7 - 10.2 mg/dL 9.3  9.2  9.3   Total Protein 6.0 - 8.5 g/dL 7.8  7.4    Total Bilirubin 0.0 - 1.2 mg/dL 0.4  <7.8    Alkaline Phos 44 - 121 IU/L 78  75    AST 0 - 40 IU/L 14  11    ALT 0 - 32 IU/L 10  13     Lipid Panel     Component Value Date/Time   CHOL 163 12/19/2021 1617   TRIG 69 12/19/2021 1617   HDL 40 12/19/2021 1617   CHOLHDL 4.1 12/19/2021 1617   LDLCALC 110 (H) 12/19/2021 1617    CBC    Component Value Date/Time   WBC 7.0 12/19/2021 1617   WBC 6.3 07/01/2020 0133   RBC 4.60 12/19/2021 1617   RBC 4.44 07/01/2020 0133   HGB 11.5 12/19/2021 1617   HCT 35.5 12/19/2021 1617   PLT 240 12/19/2021 1617   MCV 77 (L) 12/19/2021 1617   MCH 25.0 (L) 12/19/2021 1617   MCH 23.6 (L) 07/01/2020 0133   MCHC 32.4 12/19/2021 1617   MCHC 32.1 07/01/2020 0133   RDW 14.3 12/19/2021 1617   LYMPHSABS 3.4 (H) 12/19/2021 1617   MONOABS 0.3 07/01/2020 0133   EOSABS 0.1 12/19/2021 1617   BASOSABS 0.0 12/19/2021 1617    ASSESSMENT AND  PLAN:  1. Prediabetes (Primary) Discussed diagnosis with patient.  Printed information also provided. Discussed the importance of healthy eating habits, regular aerobic exercise (at least 150 minutes a week as tolerated) and medication compliance to achieve or maintain control of diabetes. Recommend getting in 30 mins of moderate intensity exercise 3-5 days a wk - POCT glycosylated hemoglobin (Hb A1C) - POCT glucose (manual entry)  2. Obesity (BMI 30.0-34.9) Discussed the importance of healthy eating habits, regular aerobic exercise (at least 150 minutes a week as tolerated) and medication compliance to achieve or maintain control of diabetes. Recommend getting in 30 mins of moderate intensity exercise 3-5 days a wk - CBC - Comprehensive metabolic panel - Lipid panel  3. History of anemia Check CBC today.  I suspect she may have SS trait or thalassemia trait  4. Encounter for immunization - Flu vaccine trivalent PF, 6mos and older(Flulaval,Afluria,Fluarix,Fluzone)     Patient was given the opportunity to ask questions.  Patient verbalized understanding of the plan and was able to repeat key elements of the plan.   This documentation was completed using Paediatric nurse.  Any transcriptional errors are unintentional.  Orders Placed This Encounter  Procedures   Flu vaccine trivalent PF, 6mos and older(Flulaval,Afluria,Fluarix,Fluzone)   CBC   Comprehensive metabolic panel   Lipid panel   POCT glycosylated hemoglobin (Hb A1C)   POCT glucose (manual entry)     Requested Prescriptions    No prescriptions requested or ordered in this encounter    Return in about 2 months (around 05/06/2023) for PAP.  Jonah Blue, MD, FACP

## 2023-03-05 NOTE — Patient Instructions (Signed)
Prediabetes Eating Plan Prediabetes is a condition that causes blood sugar (glucose) levels to be higher than normal. This increases the risk for developing type 2 diabetes (type 2 diabetes mellitus). Working with a health care provider or nutrition specialist (dietitian) to make diet and lifestyle changes can help prevent the onset of diabetes. These changes may help you: Control your blood glucose levels. Improve your cholesterol levels. Manage your blood pressure. What are tips for following this plan? Reading food labels Read food labels to check the amount of fat, salt (sodium), and sugar in prepackaged foods. Avoid foods that have: Saturated fats. Trans fats. Added sugars. Avoid foods that have more than 300 milligrams (mg) of sodium per serving. Limit your sodium intake to less than 2,300 mg each day. Shopping Avoid buying pre-made and processed foods. Avoid buying drinks with added sugar. Cooking Cook with olive oil. Do not use butter, lard, or ghee. Bake, broil, grill, steam, or boil foods. Avoid frying. Meal planning  Work with your dietitian to create an eating plan that is right for you. This may include tracking how many calories you take in each day. Use a food diary, notebook, or mobile application to track what you eat at each meal. Consider following a Mediterranean diet. This includes: Eating several servings of fresh fruits and vegetables each day. Eating fish at least twice a week. Eating one serving each day of whole grains, beans, nuts, and seeds. Using olive oil instead of other fats. Limiting alcohol. Limiting red meat. Using nonfat or low-fat dairy products. Consider following a plant-based diet. This includes dietary choices that focus on eating mostly vegetables and fruit, grains, beans, nuts, and seeds. If you have high blood pressure, you may need to limit your sodium intake or follow a diet such as the DASH (Dietary Approaches to Stop Hypertension) eating  plan. The DASH diet aims to lower high blood pressure. Lifestyle Set weight loss goals with help from your health care team. It is recommended that most people with prediabetes lose 7% of their body weight. Exercise for at least 30 minutes 5 or more days a week. Attend a support group or seek support from a mental health counselor. Take over-the-counter and prescription medicines only as told by your health care provider. What foods are recommended? Fruits Berries. Bananas. Apples. Oranges. Grapes. Papaya. Mango. Pomegranate. Kiwi. Grapefruit. Cherries. Vegetables Lettuce. Spinach. Peas. Beets. Cauliflower. Cabbage. Broccoli. Carrots. Tomatoes. Squash. Eggplant. Herbs. Peppers. Onions. Cucumbers. Brussels sprouts. Grains Whole grains, such as whole-wheat or whole-grain breads, crackers, cereals, and pasta. Unsweetened oatmeal. Bulgur. Barley. Quinoa. Brown rice. Corn or whole-wheat flour tortillas or taco shells. Meats and other proteins Seafood. Poultry without skin. Lean cuts of pork and beef. Tofu. Eggs. Nuts. Beans. Dairy Low-fat or fat-free dairy products, such as yogurt, cottage cheese, and cheese. Beverages Water. Tea. Coffee. Sugar-free or diet soda. Seltzer water. Low-fat or nonfat milk. Milk alternatives, such as soy or almond milk. Fats and oils Olive oil. Canola oil. Sunflower oil. Grapeseed oil. Avocado. Walnuts. Sweets and desserts Sugar-free or low-fat pudding. Sugar-free or low-fat ice cream and other frozen treats. Seasonings and condiments Herbs. Sodium-free spices. Mustard. Relish. Low-salt, low-sugar ketchup. Low-salt, low-sugar barbecue sauce. Low-fat or fat-free mayonnaise. The items listed above may not be a complete list of recommended foods and beverages. Contact a dietitian for more information. What foods are not recommended? Fruits Fruits canned with syrup. Vegetables Canned vegetables. Frozen vegetables with butter or cream sauce. Grains Refined white  flour and flour  products, such as bread, pasta, snack foods, and cereals. Meats and other proteins Fatty cuts of meat. Poultry with skin. Breaded or fried meat. Processed meats. Dairy Full-fat yogurt, cheese, or milk. Beverages Sweetened drinks, such as iced tea and soda. Fats and oils Butter. Lard. Ghee. Sweets and desserts Baked goods, such as cake, cupcakes, pastries, cookies, and cheesecake. Seasonings and condiments Spice mixes with added salt. Ketchup. Barbecue sauce. Mayonnaise. The items listed above may not be a complete list of foods and beverages that are not recommended. Contact a dietitian for more information. Where to find more information American Diabetes Association: www.diabetes.org Summary You may need to make diet and lifestyle changes to help prevent the onset of diabetes. These changes can help you control blood sugar, improve cholesterol levels, and manage blood pressure. Set weight loss goals with help from your health care team. It is recommended that most people with prediabetes lose 7% of their body weight. Consider following a Mediterranean diet. This includes eating plenty of fresh fruits and vegetables, whole grains, beans, nuts, seeds, fish, and low-fat dairy, and using olive oil instead of other fats. This information is not intended to replace advice given to you by your health care provider. Make sure you discuss any questions you have with your health care provider. Document Revised: 06/04/2019 Document Reviewed: 06/04/2019 Elsevier Patient Education  2024 Elsevier Inc. Prediabetes Prediabetes is when your blood sugar (blood glucose) level is higher than normal but not high enough for you to be diagnosed with type 2 diabetes. Having prediabetes puts you at risk for developing type 2 diabetes (type 2 diabetes mellitus). With certain lifestyle changes, you may be able to prevent or delay the onset of type 2 diabetes. This is important because type 2  diabetes can lead to serious complications, such as: Heart disease. Stroke. Blindness. Kidney disease. Depression. Poor circulation in the feet and legs. In severe cases, this could lead to surgical removal of a leg (amputation). What are the causes? The exact cause of prediabetes is not known. It may result from insulin resistance. Insulin resistance develops when cells in the body do not respond properly to insulin that the body makes. This can cause excess glucose to build up in the blood. High blood glucose (hyperglycemia) can develop. What increases the risk? The following factors may make you more likely to develop this condition: You have a family member with type 2 diabetes. You are older than 45 years. You had a temporary form of diabetes during a pregnancy (gestational diabetes). You had polycystic ovary syndrome (PCOS). You are overweight or obese. You are inactive (sedentary). You have a history of heart disease, including problems with cholesterol levels, high levels of blood fats, or high blood pressure. What are the signs or symptoms? You may have no symptoms. If you do have symptoms, they may include: Increased hunger. Increased thirst. Increased urination. Vision changes, such as blurry vision. Tiredness (fatigue). How is this diagnosed? This condition can be diagnosed with blood tests. Your blood glucose may be checked with one or more of the following tests: A fasting blood glucose (FBG) test. You will not be allowed to eat (you will fast) for at least 8 hours before a blood sample is taken. An A1C blood test (hemoglobin A1C). This test provides information about blood glucose levels over the previous 2?3 months. An oral glucose tolerance test (OGTT). This test measures your blood glucose at two points in time: After fasting. This is your baseline level. Two hours  after you drink a beverage that contains glucose. You may be diagnosed with prediabetes if: Your FBG  is 100?125 mg/dL (0.8-6.5 mmol/L). Your A1C level is 5.7?6.4% (39-46 mmol/mol). Your OGTT result is 140?199 mg/dL (7.8-46 mmol/L). These blood tests may be repeated to confirm your diagnosis. How is this treated? Treatment may include dietary and lifestyle changes to help lower your blood glucose and prevent type 2 diabetes from developing. In some cases, medicine may be prescribed to help lower the risk of type 2 diabetes. Follow these instructions at home: Nutrition  Follow a healthy meal plan. This includes eating lean proteins, whole grains, legumes, fresh fruits and vegetables, low-fat dairy products, and healthy fats. Follow instructions from your health care provider about eating or drinking restrictions. Meet with a dietitian to create a healthy eating plan that is right for you. Lifestyle Do moderate-intensity exercise for at least 30 minutes a day on 5 or more days each week, or as told by your health care provider. A mix of activities may be best, such as: Brisk walking, swimming, biking, and weight lifting. Lose weight as told by your health care provider. Losing 5-7% of your body weight can reverse insulin resistance. Do not drink alcohol if: Your health care provider tells you not to drink. You are pregnant, may be pregnant, or are planning to become pregnant. If you drink alcohol: Limit how much you use to: 0-1 drink a day for women. 0-2 drinks a day for men. Be aware of how much alcohol is in your drink. In the [place] one drink equals one 12 oz bottle of beer (355 mL), one 5 oz glass of wine (148 mL), or one 1 oz glass of hard liquor (44 mL). General instructions Take over-the-counter and prescription medicines only as told by your health care provider. You may be prescribed medicines that help lower the risk of type 2 diabetes. Do not use any products that contain nicotine or tobacco, such as cigarettes, e-cigarettes, and chewing tobacco. If you need help quitting, ask your  health care provider. Keep all follow-up visits. This is important. Where to find more information American Diabetes Association: www.diabetes.org Academy of Nutrition and Dietetics: www.eatright.org American Heart Association: www.heart.org Contact a health care provider if: You have any of these symptoms: Increased hunger. Increased urination. Increased thirst. Fatigue. Vision changes, such as blurry vision. Get help right away if you: Have shortness of breath. Feel confused. Vomit or feel like you may vomit. Summary Prediabetes is when your blood sugar (blood glucose)level is higher than normal but not high enough for you to be diagnosed with type 2 diabetes. Having prediabetes puts you at risk for developing type 2 diabetes (type 2 diabetes mellitus). Make lifestyle changes such as eating a healthy diet and exercising regularly to help prevent diabetes. Lose weight as told by your health care provider. This information is not intended to replace advice given to you by your health care provider. Make sure you discuss any questions you have with your health care provider. Document Revised: 06/04/2019 Document Reviewed: 06/04/2019 Elsevier Patient Education  2024 [employer].

## 2023-03-06 LAB — COMPREHENSIVE METABOLIC PANEL
ALT: 13 [IU]/L (ref 0–32)
AST: 19 [IU]/L (ref 0–40)
Albumin: 4.1 g/dL (ref 3.9–4.9)
Alkaline Phosphatase: 92 [IU]/L (ref 44–121)
BUN/Creatinine Ratio: 14 (ref 9–23)
BUN: 11 mg/dL (ref 6–24)
Bilirubin Total: 0.2 mg/dL (ref 0.0–1.2)
CO2: 23 mmol/L (ref 20–29)
Calcium: 9.1 mg/dL (ref 8.7–10.2)
Chloride: 102 mmol/L (ref 96–106)
Creatinine, Ser: 0.77 mg/dL (ref 0.57–1.00)
Globulin, Total: 3.3 g/dL (ref 1.5–4.5)
Glucose: 90 mg/dL (ref 70–99)
Potassium: 4.5 mmol/L (ref 3.5–5.2)
Sodium: 138 mmol/L (ref 134–144)
Total Protein: 7.4 g/dL (ref 6.0–8.5)
eGFR: 97 mL/min/{1.73_m2} (ref >59)

## 2023-03-06 LAB — CBC
Hematocrit: 36 % (ref 34.0–46.6)
Hemoglobin: 11 g/dL — ABNORMAL LOW (ref 11.1–15.9)
MCH: 23.8 pg — ABNORMAL LOW (ref 26.6–33.0)
MCHC: 30.6 g/dL — ABNORMAL LOW (ref 31.5–35.7)
MCV: 78 fL — ABNORMAL LOW (ref 79–97)
Platelets: 271 10*3/uL (ref 150–450)
RBC: 4.63 x10E6/uL (ref 3.77–5.28)
RDW: 15.1 % (ref 11.7–15.4)
WBC: 6.1 10*3/uL (ref 3.4–10.8)

## 2023-03-06 LAB — LIPID PANEL
Chol/HDL Ratio: 4.3 {ratio} (ref 0.0–4.4)
Cholesterol, Total: 163 mg/dL (ref 100–199)
HDL: 38 mg/dL — ABNORMAL LOW (ref >39)
LDL Chol Calc (NIH): 112 mg/dL — ABNORMAL HIGH (ref 0–99)
Triglycerides: 69 mg/dL (ref 0–149)
VLDL Cholesterol Cal: 13 mg/dL (ref 5–40)

## 2023-03-06 NOTE — Progress Notes (Signed)
Let patient know that blood cell count is in the low normal range.  I suspect she may have iron deficiency or sickle cell trait.  Will discuss further on her next visit when she comes to have Pap smear done. Cholesterol level mildly elevated at 112 with goal being less than 100.  Healthy eating habits and regular exercise will help to lower cholesterol. Kidney and liver function tests are normal.

## 2023-04-28 IMAGING — CT CT CHEST W/O CM
2 of 5 series · 15 of 36 positions shown, 18 images · non-contrast
Comparison: 01/31/2021, 07/01/2020

CLINICAL DATA: Follow-up lung nodule



[Series 6: chest 2.00 br40 s3 · coronal · 0.56mm/px · 3 of 148 slices shown]
[im 30/148  lung]
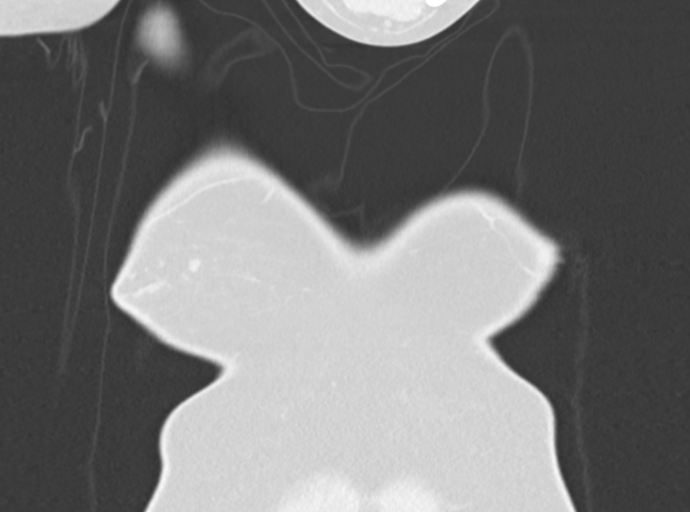
[im 59/148  lung]
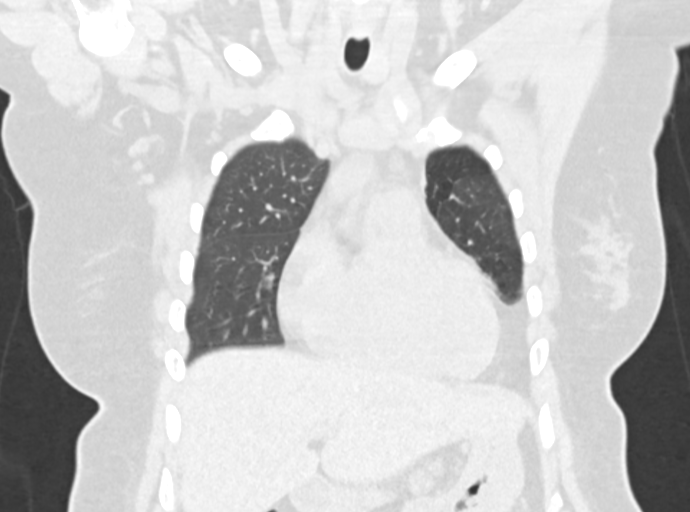
[im 89/148  lung]
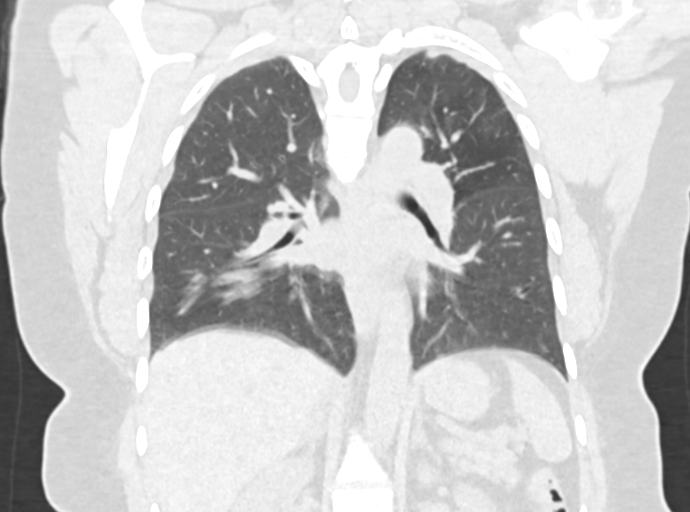

[Series 10: chest 1.00 br40 s3 super d · axial · 0.69mm/px · z∈[+1553,+1800]mm · 12 of 357 slices shown, 15 images]
[im 24/357  mediastinal]
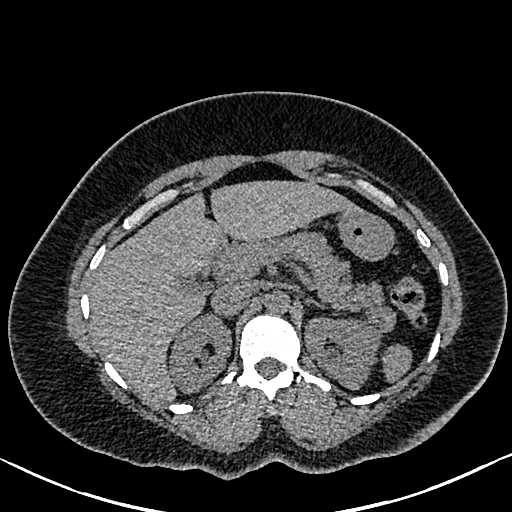
[im 24/357  lung]
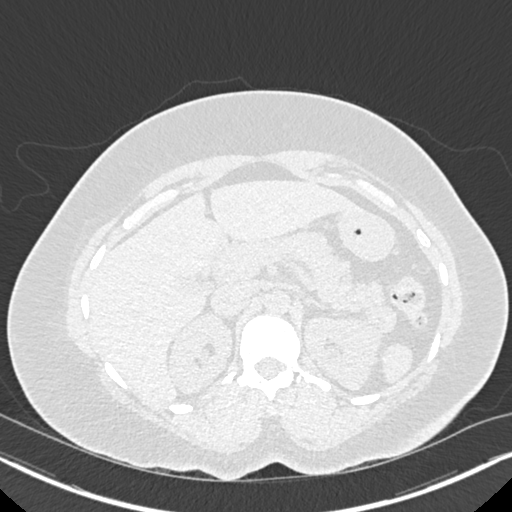
[im 48/357  lung]
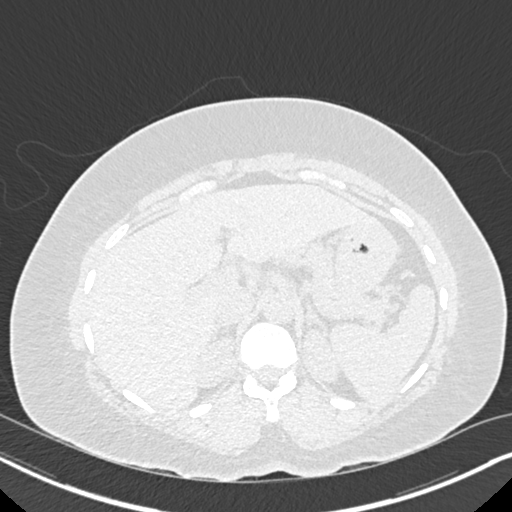
[im 72/357  lung]
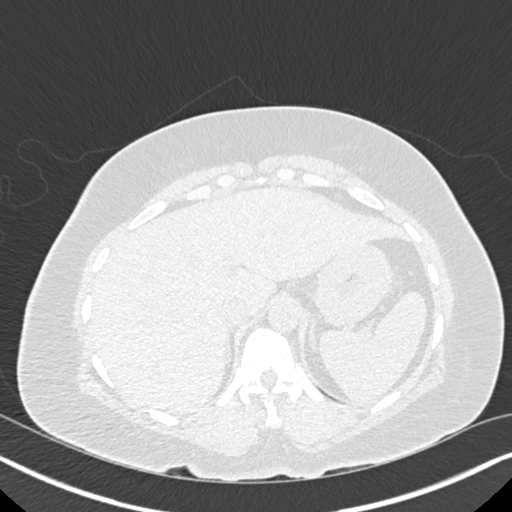
[im 119/357  lung]
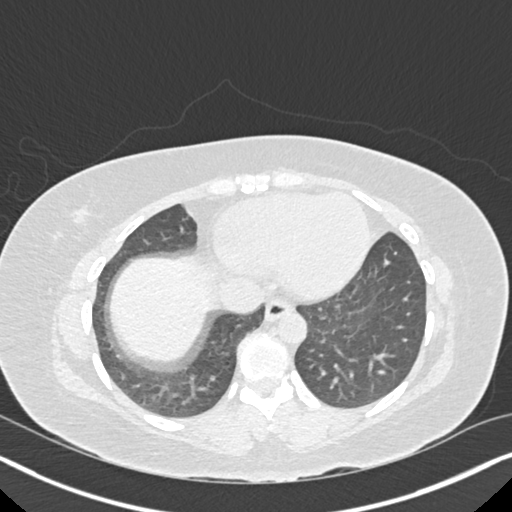
[im 143/357  mediastinal]
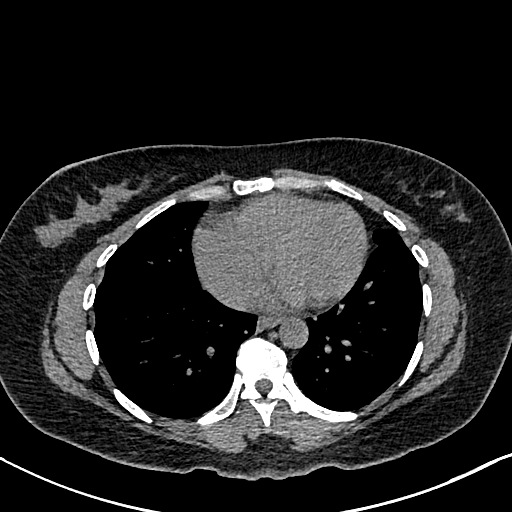
[im 143/357  lung]
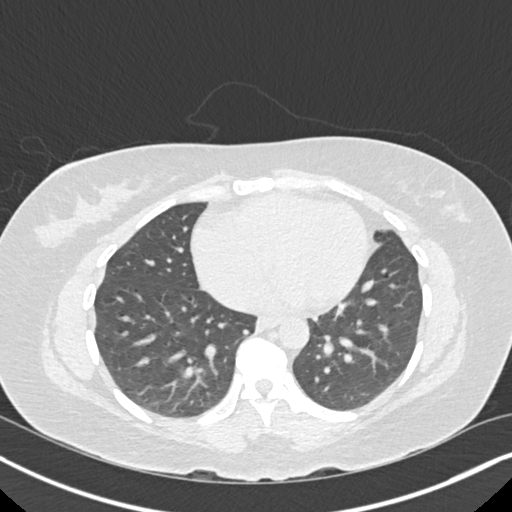
[im 167/357  lung]
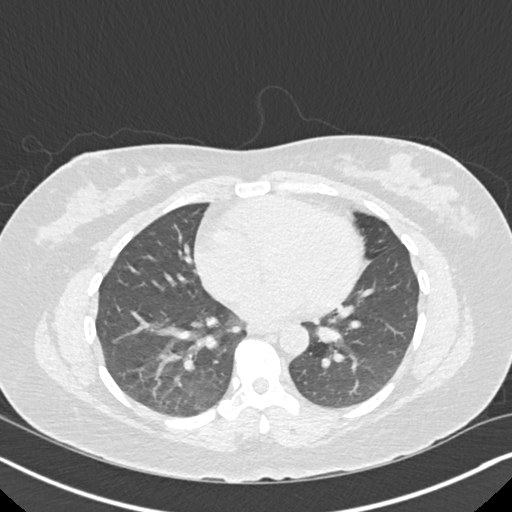
[im 190/357  lung]
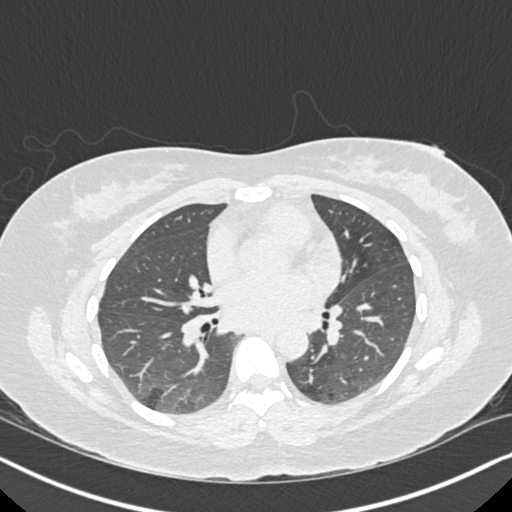
[im 214/357  lung]
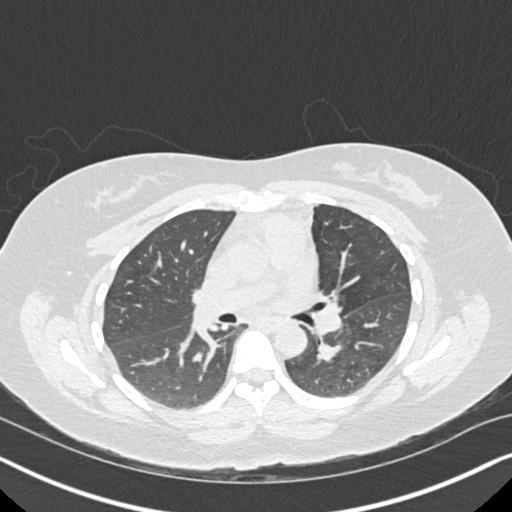
[im 238/357  mediastinal]
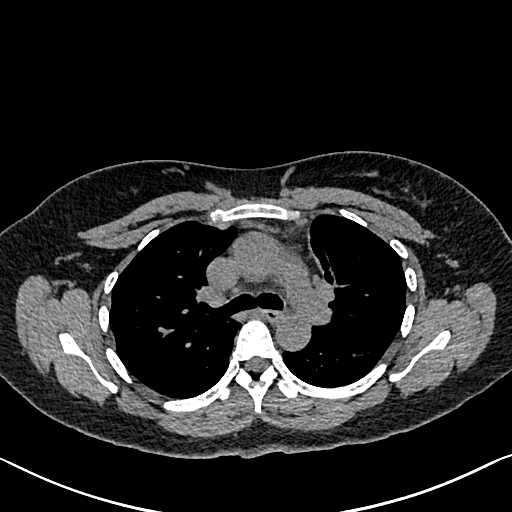
[im 238/357  lung]
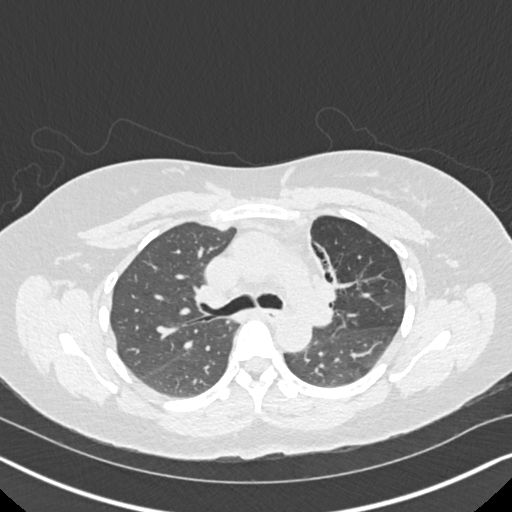
[im 285/357  lung]
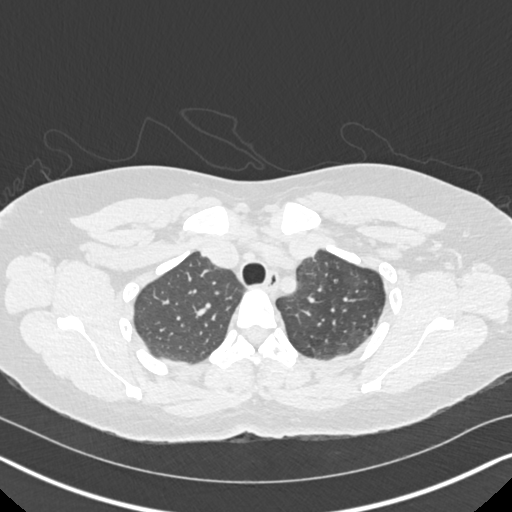
[im 309/357  lung]
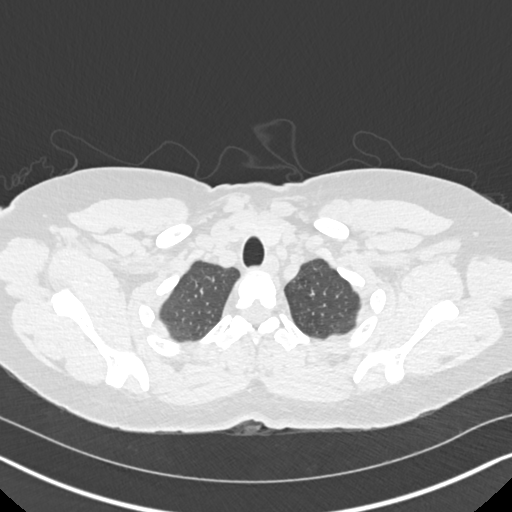
[im 333/357  lung]
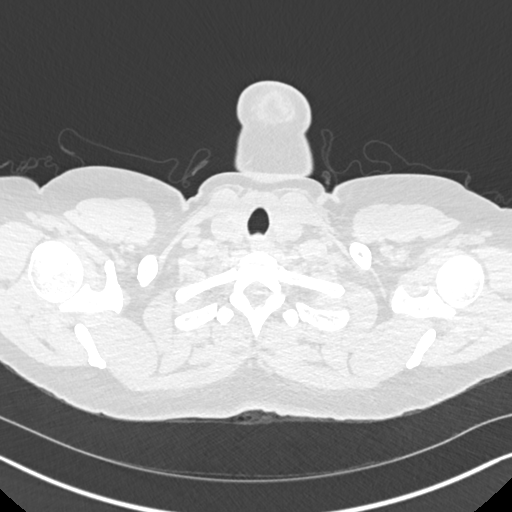

[15 of 36 positions shown; findings below may reference images not displayed]

FINDINGS: Cardiovascular: No significant vascular findings. Normal heart size.
No pericardial effusion.

Mediastinum/Nodes: No enlarged mediastinal, hilar, or axillary lymph
nodes. Thyroid gland, trachea, and esophagus demonstrate no
significant findings.

Lungs/Pleura: Stable, benign 0.4 cm nodule of the left lower lobe
(series 2, image 79) as well as a 0.3 cm nodule of the anterior left
upper lobe (series 2, image 43). Unchanged paramedian scarring and
bronchiectasis of the suprahilar left upper lobe (series 2, image
48). Mosaic attenuation of the airspaces. No pleural effusion or
pneumothorax.

Upper Abdomen: No acute abnormality.  Gallstones.

Musculoskeletal: No chest wall abnormality. No suspicious osseous
lesions identified.
IMPRESSION: 1. Stable, definitively benign small pulmonary nodules measuring
cm and smaller, for which no further follow-up or characterization
is required.
2. Unchanged paramedian scarring and bronchiectasis of the
suprahilar left upper lobe.
3. Mosaic attenuation of the airspaces, suggesting small airways
disease
4. Cholelithiasis.

## 2023-05-13 ENCOUNTER — Other Ambulatory Visit (HOSPITAL_COMMUNITY)
Admission: RE | Admit: 2023-05-13 | Discharge: 2023-05-13 | Disposition: A | Discharge status: Home / Self Care | Payer: BC Managed Care – PPO | Source: Ambulatory Visit | Attending: Internal Medicine | Admitting: Internal Medicine

## 2023-05-13 ENCOUNTER — Encounter: Payer: Self-pay | Admitting: Internal Medicine

## 2023-05-13 ENCOUNTER — Ambulatory Visit: Payer: BC Managed Care – PPO | Attending: Internal Medicine | Admitting: Internal Medicine

## 2023-05-13 VITALS — BP 115/76 | HR 79 | Temp 98.6°F | Ht 66.0 in | Wt 206.0 lb

## 2023-05-13 DIAGNOSIS — D509 Iron deficiency anemia, unspecified: Secondary | ICD-10-CM

## 2023-05-13 DIAGNOSIS — Z113 Encounter for screening for infections with a predominantly sexual mode of transmission: Secondary | ICD-10-CM | POA: Insufficient documentation

## 2023-05-13 DIAGNOSIS — Z124 Encounter for screening for malignant neoplasm of cervix: Secondary | ICD-10-CM

## 2023-05-13 DIAGNOSIS — Z114 Encounter for screening for human immunodeficiency virus [HIV]: Secondary | ICD-10-CM | POA: Diagnosis not present

## 2023-05-13 NOTE — Progress Notes (Signed)
 Patient ID: Amber Alvarado, female    DOB: Mar 24, 1980  MRN: [id number]  CC: Gynecologic Exam (Pap. /No questions / concerns/Already received flu vax)   Subjective: Amber Alvarado is a 43 y.o. female who presents for chronic ds management. Her concerns today include:  History of bronchiectasis, obesity, prediabetes, HL, microcytosis   [employer] Language interpreter used during this encounter. #[id number], Amandine   GYN History:  Pt is G4P3 (miscarriage) Any hx of abn paps?: no Menses regular or irregular?: regular How long does menses last? 3-5 days Menstrual flow light or heavy?: normal flow. Method of birth control?:  no and does not want to be on anything at this time Any vaginal dischg at this time?: no Dysuria?: no Any hx of STI?: yes. Trichomonas in 2020. Treated and spouse treated as well Sexually active with how many partners: Sexually active with spouse only. Desires STI screen: yes Last MMG: 10/2022 Family hx of uterine, cervical or breast cancer?:  no  Pt has mild anemia with chronic microcytosis.  No hx of [place] trait.  Reports being on iron in past but no longer on it. Patient Active Problem List   Diagnosis Date Noted   Breast calcification seen on mammogram 11/22/2020   Bronchiectasis (HCC) 11/22/2020   BMI 31.0-31.9,adult 11/22/2020   Prediabetes 08/07/2017   Low HDL (under 40) 08/07/2017     No current outpatient medications on file prior to visit.   No current facility-administered medications on file prior to visit.    No Known Allergies  Social History   Socioeconomic History   Marital status: Married    Spouse name: Not on file   Number of children: Not on file   Years of education: Not on file   Highest education level: Not on file  Occupational History   Not on file  Tobacco Use   Smoking status: Never   Smokeless tobacco: Never  Substance and Sexual Activity   Alcohol use: Not on file   Drug use: Not on file   Sexual  activity: Yes  Other Topics Concern   Not on file  Social History Narrative   Not on file   Social Drivers of Health   Financial Resource Strain: Medium Risk (03/05/2023)   Overall Financial Resource Strain (CARDIA)    Difficulty of Paying Living Expenses: Somewhat hard  Food Insecurity: No Food Insecurity (03/05/2023)   Hunger Vital Sign    Worried About Running Out of Food in the Last Year: Never true    Ran Out of Food in the Last Year: Never true  Transportation Needs: No Transportation Needs (03/05/2023)   PRAPARE - Administrator, Civil Service (Medical): No    Lack of Transportation (Non-Medical): No  Physical Activity: Inactive (03/05/2023)   Exercise Vital Sign    Days of Exercise per Week: 0 days    Minutes of Exercise per Session: 0 min  Stress: No Stress Concern Present (03/05/2023)   [employer] of Occupational Health - Occupational Stress Questionnaire    Feeling of Stress : Not at all  Social Connections: Moderately Integrated (03/05/2023)   Social Connection and Isolation Panel [NHANES]    Frequency of Communication with Friends and Family: More than three times a week    Frequency of Social Gatherings with Friends and Family: More than three times a week    Attends Religious Services: More than 4 times per year    Active Member of Clubs or Organizations: No  Attends Banker Meetings: Never    Marital Status: Married  Catering manager Violence: Not At Risk (03/05/2023)   Humiliation, Afraid, Rape, and Kick questionnaire    Fear of Current or Ex-Partner: No    Emotionally Abused: No    Physically Abused: No    Sexually Abused: No    Family History  Problem Relation Age of Onset   Diabetes Father     No past surgical history on file.  ROS: Review of Systems Negative except as stated above  PHYSICAL EXAM: BP 115/76 (BP Location: Left Arm, Patient Position: Sitting, Cuff Size: Normal)   Pulse 79   Temp 98.6 F (37 C)  (Oral)   Ht 5\' 6"  (1.676 m)   Wt 206 lb (93.4 kg)   SpO2 100%   BMI 33.25 kg/m   Physical Exam  General appearance - alert, well appearing, and in no distress Mental status - normal mood, behavior, speech, dress, motor activity, and thought processes Pelvic - CMA Clarissa present: normal external genitalia, vulva, vagina, cervix, uterus and adnexa      Latest Ref Rng & Units 03/05/2023    4:55 PM 12/19/2021    4:17 PM 11/22/2020    4:05 PM  CMP  Glucose 70 - 99 mg/dL 90  68  77   BUN 6 - 24 mg/dL 11  13  12    Creatinine 0.57 - 1.00 mg/dL 2.13  0.86  5.78   Sodium 134 - 144 mmol/L 138  138  141   Potassium 3.5 - 5.2 mmol/L 4.5  4.0  4.6   Chloride 96 - 106 mmol/L 102  101  104   CO2 20 - 29 mmol/L 23  25  26    Calcium 8.7 - 10.2 mg/dL 9.1  9.3  9.2   Total Protein 6.0 - 8.5 g/dL 7.4  7.8  7.4   Total Bilirubin 0.0 - 1.2 mg/dL 0.2  0.4  <4.6   Alkaline Phos 44 - 121 IU/L 92  78  75   AST 0 - 40 IU/L 19  14  11    ALT 0 - 32 IU/L 13  10  13     Lipid Panel     Component Value Date/Time   CHOL 163 03/05/2023 1655   TRIG 69 03/05/2023 1655   HDL 38 (L) 03/05/2023 1655   CHOLHDL 4.3 03/05/2023 1655   LDLCALC 112 (H) 03/05/2023 1655    CBC    Component Value Date/Time   WBC 6.1 03/05/2023 1655   WBC 6.3 07/01/2020 0133   RBC 4.63 03/05/2023 1655   RBC 4.44 07/01/2020 0133   HGB 11.0 (L) 03/05/2023 1655   HCT 36.0 03/05/2023 1655   PLT 271 03/05/2023 1655   MCV 78 (L) 03/05/2023 1655   MCH 23.8 (L) 03/05/2023 1655   MCH 23.6 (L) 07/01/2020 0133   MCHC 30.6 (L) 03/05/2023 1655   MCHC 32.1 07/01/2020 0133   RDW 15.1 03/05/2023 1655   LYMPHSABS 3.4 (H) 12/19/2021 1617   MONOABS 0.3 07/01/2020 0133   EOSABS 0.1 12/19/2021 1617   BASOSABS 0.0 12/19/2021 1617    ASSESSMENT AND PLAN:  1. Pap smear for cervical cancer screening (Primary) - Cytology - PAP - Cervicovaginal ancillary only  2. Microcytic anemia Will check iron studies - Iron, TIBC and Ferritin Panel -  CBC  3. Screening for HIV (human immunodeficiency virus) Pt agreeable to testing - HIV antibody (with reflex)   Patient was given the opportunity to ask questions.  Patient verbalized understanding of the plan and was able to repeat key elements of the plan.   This documentation was completed using Paediatric nurse.  Any transcriptional errors are unintentional.  Orders Placed This Encounter  Procedures   Iron, TIBC and Ferritin Panel   CBC   HIV antibody (with reflex)     Requested Prescriptions    No prescriptions requested or ordered in this encounter    No follow-ups on file.  Jonah Blue, MD, FACP

## 2023-05-14 LAB — CBC
Hematocrit: 35.1 % (ref 34.0–46.6)
Hemoglobin: 11 g/dL — ABNORMAL LOW (ref 11.1–15.9)
MCH: 24.2 pg — ABNORMAL LOW (ref 26.6–33.0)
MCHC: 31.3 g/dL — ABNORMAL LOW (ref 31.5–35.7)
MCV: 77 fL — ABNORMAL LOW (ref 79–97)
NRBC: 1 % — ABNORMAL HIGH (ref 0–0)
Platelets: 238 10*3/uL (ref 150–450)
RBC: 4.54 x10E6/uL (ref 3.77–5.28)
RDW: 14.7 % (ref 11.7–15.4)
WBC: 6.4 10*3/uL (ref 3.4–10.8)

## 2023-05-14 LAB — IRON,TIBC AND FERRITIN PANEL
Ferritin: 32 ng/mL (ref 15–150)
Iron Saturation: 8 % — CL (ref 15–55)
Iron: 27 ug/dL (ref 27–159)
Total Iron Binding Capacity: 352 ug/dL (ref 250–450)
UIBC: 325 ug/dL (ref 131–425)

## 2023-05-14 LAB — HIV ANTIBODY (ROUTINE TESTING W REFLEX): HIV Screen 4th Generation wRfx: NONREACTIVE

## 2023-05-15 ENCOUNTER — Other Ambulatory Visit: Payer: Self-pay | Admitting: Internal Medicine

## 2023-05-15 DIAGNOSIS — D509 Iron deficiency anemia, unspecified: Secondary | ICD-10-CM

## 2023-05-15 LAB — CERVICOVAGINAL ANCILLARY ONLY
Bacterial Vaginitis (gardnerella): NEGATIVE
Candida Glabrata: NEGATIVE
Candida Vaginitis: NEGATIVE
Chlamydia: NEGATIVE
Comment: NEGATIVE
Comment: NEGATIVE
Comment: NEGATIVE
Comment: NEGATIVE
Comment: NEGATIVE
Comment: NORMAL
Neisseria Gonorrhea: NEGATIVE
Trichomonas: NEGATIVE

## 2023-05-15 MED ORDER — FERROUS SULFATE 325 (65 FE) MG PO TABS: 325.0000 mg | ORAL_TABLET | Freq: Every day | ORAL | 0 refills | Status: DC

## 2023-05-15 NOTE — Progress Notes (Signed)
 Let patient know that she has iron deficiency anemia.  I recommend taking iron supplement daily.  A prescription has been sent to [facility] [address] for her to pick up. However, her insurance may not pay for it as it can be purchased OTC.  Let her know that the name of the over-the-counter medication is Ferrous Sulfate 65 mg. Take one tablet daily for 3 mths then return to the lab at that time to have levels rechecked. Orders entered.  screen for HIV is negative.

## 2023-05-16 LAB — CYTOLOGY - PAP
Comment: NEGATIVE
Diagnosis: UNDETERMINED — AB
High risk HPV: NEGATIVE

## 2023-08-19 ENCOUNTER — Ambulatory Visit: Payer: BC Managed Care – PPO | Attending: Internal Medicine

## 2023-08-19 DIAGNOSIS — D509 Iron deficiency anemia, unspecified: Secondary | ICD-10-CM

## 2023-08-20 ENCOUNTER — Ambulatory Visit: Payer: Self-pay | Admitting: Nurse Practitioner

## 2023-08-20 ENCOUNTER — Other Ambulatory Visit: Payer: Self-pay | Admitting: Internal Medicine

## 2023-08-20 LAB — CBC
Hematocrit: 36.3 % (ref 34.0–46.6)
Hemoglobin: 11.5 g/dL (ref 11.1–15.9)
MCH: 25.2 pg — ABNORMAL LOW (ref 26.6–33.0)
MCHC: 31.7 g/dL (ref 31.5–35.7)
MCV: 80 fL (ref 79–97)
Platelets: 228 10*3/uL (ref 150–450)
RBC: 4.56 x10E6/uL (ref 3.77–5.28)
RDW: 14.7 % (ref 11.7–15.4)
WBC: 6 10*3/uL (ref 3.4–10.8)

## 2023-08-20 LAB — IRON,TIBC AND FERRITIN PANEL
Ferritin: 77 ng/mL (ref 15–150)
Iron Saturation: 32 % (ref 15–55)
Iron: 82 ug/dL (ref 27–159)
Total Iron Binding Capacity: 257 ug/dL (ref 250–450)
UIBC: 175 ug/dL (ref 131–425)

## 2023-10-23 ENCOUNTER — Other Ambulatory Visit: Payer: Self-pay | Admitting: Critical Care Medicine

## 2023-10-23 DIAGNOSIS — Z1231 Encounter for screening mammogram for malignant neoplasm of breast: Secondary | ICD-10-CM

## 2023-11-01 ENCOUNTER — Ambulatory Visit
Admission: RE | Admit: 2023-11-01 | Discharge: 2023-11-01 | Disposition: A | Discharge status: Home / Self Care | Source: Ambulatory Visit | Attending: Critical Care Medicine | Admitting: Critical Care Medicine

## 2023-11-01 DIAGNOSIS — Z1231 Encounter for screening mammogram for malignant neoplasm of breast: Secondary | ICD-10-CM

## 2023-11-06 ENCOUNTER — Ambulatory Visit: Payer: Self-pay | Admitting: Critical Care Medicine

## 2023-11-06 NOTE — Progress Notes (Signed)
 Let pt know mammogram normal. Recheck one year

## 2023-11-11 ENCOUNTER — Ambulatory Visit: Payer: BC Managed Care – PPO | Admitting: Internal Medicine

## 2023-11-15 ENCOUNTER — Other Ambulatory Visit: Payer: Self-pay | Admitting: Internal Medicine

## 2023-12-12 ENCOUNTER — Telehealth: Payer: Self-pay | Admitting: Internal Medicine

## 2023-12-12 NOTE — Telephone Encounter (Signed)
 Confirmed appt for 9/26

## 2023-12-13 ENCOUNTER — Ambulatory Visit: Attending: Internal Medicine | Admitting: Internal Medicine

## 2023-12-13 ENCOUNTER — Encounter: Payer: Self-pay | Admitting: Internal Medicine

## 2023-12-13 VITALS — BP 107/73 | HR 83 | Temp 98.4°F | Ht 66.0 in | Wt 198.0 lb

## 2023-12-13 DIAGNOSIS — Z23 Encounter for immunization: Secondary | ICD-10-CM

## 2023-12-13 DIAGNOSIS — R7303 Prediabetes: Secondary | ICD-10-CM | POA: Diagnosis not present

## 2023-12-13 DIAGNOSIS — D509 Iron deficiency anemia, unspecified: Secondary | ICD-10-CM

## 2023-12-13 DIAGNOSIS — Z0289 Encounter for other administrative examinations: Secondary | ICD-10-CM

## 2023-12-13 DIAGNOSIS — H04201 Unspecified epiphora, right lacrimal gland: Secondary | ICD-10-CM | POA: Diagnosis not present

## 2023-12-13 DIAGNOSIS — E6609 Other obesity due to excess calories: Secondary | ICD-10-CM | POA: Diagnosis not present

## 2023-12-13 DIAGNOSIS — E66811 Obesity, class 1: Secondary | ICD-10-CM

## 2023-12-13 DIAGNOSIS — H5203 Hypermetropia, bilateral: Secondary | ICD-10-CM | POA: Diagnosis not present

## 2023-12-13 DIAGNOSIS — Z6831 Body mass index (BMI) 31.0-31.9, adult: Secondary | ICD-10-CM

## 2023-12-13 LAB — POCT GLYCOSYLATED HEMOGLOBIN (HGB A1C): HbA1c, POC (controlled diabetic range): 6.1 % (ref 0.0–7.0)

## 2023-12-13 LAB — GLUCOSE, POCT (MANUAL RESULT ENTRY): POC Glucose: 92 mg/dL (ref 70–99)

## 2023-12-13 NOTE — Progress Notes (Signed)
 Patient ID: Amber Alvarado, female    DOB: 1980/11/13  MRN: [id number]  CC: Chronic Disease Management (Chronic Disease Management/Requesting FMLA form completion /Declined to further specify concerns with me. Requesting to speak to PCP only. /Flu vax administered on 12/12/2024 - C.A.)   Subjective: Amber Alvarado is a 43 y.o. female who presents for chronic ds management. Her concerns today include:  History of bronchiectasis, obesity, prediabetes, HL, microcytosis   [employer] Language interpreter used during this encounter. #[id number]   Discussed the use of AI scribe software for clinical note transcription with the patient, who gave verbal consent to proceed.  History of Present Illness Amber Alvarado is a 43 year old female who presents for a routine six-month health check-up and to discuss issues with her eyes.  She experiences tearing in her right eye when exposed to cold air from a fan, particularly at work. There is no associated itching, pain, or vision problems in the affected eye. She uses protective eyewear at work, but it is not always effective in preventing the wind from reaching her eyes. Additionally, she has difficulty reading fine print, such as in the Bible, which she attributes to potential eye strain.  PreDM:  Results for orders placed or performed in visit on 12/13/23  POCT glucose (manual entry)   Collection Time: 12/13/23  3:38 PM  Result Value Ref Range   POC Glucose 92 70 - 99 mg/dl  POCT glycosylated hemoglobin (Hb A1C)   Collection Time: 12/13/23  3:40 PM  Result Value Ref Range   Hemoglobin A1C     HbA1c POC (<> result, manual entry)     HbA1c, POC (prediabetic range)     HbA1c, POC (controlled diabetic range) 6.1 0.0 - 7.0 %   Recent finger prick tests indicate she remains in the prediabetic range. She has lost weight since her last visit in February, currently weighing 192 pounds, down from 206 pounds.  Anemia: She is  still taking iron  supplement.  Last CBC showed globin of 11.5 with normal MCV. 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  She gets 10 days off the year.  She has 5 children between the ages of 45 and 32.  If she is sick or has a doctor's appointment or when her children are sick or have doctors appointments, she has to take off work and ends up accumulating points.  She now has 4 points.  If she gets 7 points she will be fired.  Her youngest child has to see the orthodontist every 2 to 3 weeks to have braces adjusted.    Patient Active Problem List   Diagnosis Date Noted   Breast calcification seen on mammogram 11/22/2020   Bronchiectasis (HCC) 11/22/2020   BMI 31.0-31.9,adult 11/22/2020   Prediabetes 08/07/2017   Low HDL (under 40) 08/07/2017     Current Outpatient Medications on File Prior to Visit  Medication Sig Dispense Refill   ferrous sulfate  325 (65 FE) MG tablet TAKE 1 TABLET BY MOUTH EVERY DAY WITH BREAKFAST 90 tablet 1   No current facility-administered medications on file prior to visit.    No Known Allergies  Social History   Socioeconomic History  Marital status: Married    Spouse name: Not on file   Number of children: Not on file   Years of education: Not on file   Highest education level: Not on file  Occupational History   Not on file  Tobacco Use   Smoking status: Never   Smokeless tobacco: Never  Substance and Sexual Activity   Alcohol use: Not on file   Drug use: Not on file   Sexual activity: Yes  Other Topics Concern   Not on file  Social History Narrative   Not on file   Social Drivers of Health   Financial Resource Strain: Medium Risk (03/05/2023)   Overall Financial Resource  Strain (CARDIA)    Difficulty of Paying Living Expenses: Somewhat hard  Food Insecurity: No Food Insecurity (03/05/2023)   Hunger Vital Sign    Worried About Running Out of Food in the Last Year: Never true    Ran Out of Food in the Last Year: Never true  Transportation Needs: No Transportation Needs (03/05/2023)   PRAPARE - Administrator, Civil Service (Medical): No    Lack of Transportation (Non-Medical): No  Physical Activity: Inactive (03/05/2023)   Exercise Vital Sign    Days of Exercise per Week: 0 days    Minutes of Exercise per Session: 0 min  Stress: No Stress Concern Present (03/05/2023)   [employer] of Occupational Health - Occupational Stress Questionnaire    Feeling of Stress : Not at all  Social Connections: Moderately Integrated (03/05/2023)   Social Connection and Isolation Panel    Frequency of Communication with Friends and Family: More than three times a week    Frequency of Social Gatherings with Friends and Family: More than three times a week    Attends Religious Services: More than 4 times per year    Active Member of [employer] or Organizations: No    Attends Banker Meetings: Never    Marital Status: Married  Catering manager Violence: Not At Risk (03/05/2023)   Humiliation, Afraid, Rape, and Kick questionnaire    Fear of Current or Ex-Partner: No    Emotionally Abused: No    Physically Abused: No    Sexually Abused: No    Family History  Problem Relation Age of Onset   Diabetes Father    Breast cancer Neg Hx    BRCA 1/2 Neg Hx     History reviewed. No pertinent surgical history.  ROS: Review of Systems Negative except as stated above  PHYSICAL EXAM: BP 107/73 (BP Location: Left Arm, Patient Position: Sitting, Cuff Size: Normal)   Pulse 83   Temp 98.4 F (36.9 C) (Oral)   Ht 5' 6 (1.676 m)   Wt 198 lb (89.8 kg)   SpO2 99%   BMI 31.96 kg/m   Wt Readings from Last 3 Encounters:  12/13/23 198 lb (89.8 kg)   05/13/23 206 lb (93.4 kg)  03/05/23 202 lb (91.6 kg)    Physical Exam  General appearance - alert, well appearing, and in no distress Mental status - normal mood, behavior, speech, dress, motor activity, and thought processes Chest - clear to auscultation, no wheezes, rales or rhonchi, symmetric air entry Heart - normal rate, regular rhythm, normal S1, S2, no murmurs, rubs, clicks or gallops Extremities - peripheral pulses normal, no pedal edema, no clubbing or cyanosis      Latest Ref Rng & Units 03/05/2023    4:55 PM 12/19/2021    4:17 PM 11/22/2020  4:05 PM  CMP  Glucose 70 - 99 mg/dL 90  68  77   BUN 6 - 24 mg/dL 11  13  12    Creatinine 0.57 - 1.00 mg/dL 9.22  9.15  9.24   Sodium 134 - 144 mmol/L 138  138  141   Potassium 3.5 - 5.2 mmol/L 4.5  4.0  4.6   Chloride 96 - 106 mmol/L 102  101  104   CO2 20 - 29 mmol/L 23  25  26    Calcium 8.7 - 10.2 mg/dL 9.1  9.3  9.2   Total Protein 6.0 - 8.5 g/dL 7.4  7.8  7.4   Total Bilirubin 0.0 - 1.2 mg/dL 0.2  0.4  <9.7   Alkaline Phos 44 - 121 IU/L 92  78  75   AST 0 - 40 IU/L 19  14  11    ALT 0 - 32 IU/L 13  10  13     Lipid Panel     Component Value Date/Time   CHOL 163 03/05/2023 1655   TRIG 69 03/05/2023 1655   HDL 38 (L) 03/05/2023 1655   CHOLHDL 4.3 03/05/2023 1655   LDLCALC 112 (H) 03/05/2023 1655    CBC    Component Value Date/Time   WBC 6.0 08/19/2023 1529   WBC 6.3 07/01/2020 0133   RBC 4.56 08/19/2023 1529   RBC 4.44 07/01/2020 0133   HGB 11.5 08/19/2023 1529   HCT 36.3 08/19/2023 1529   PLT 228 08/19/2023 1529   MCV 80 08/19/2023 1529   MCH 25.2 (L) 08/19/2023 1529   MCH 23.6 (L) 07/01/2020 0133   MCHC 31.7 08/19/2023 1529   MCHC 32.1 07/01/2020 0133   RDW 14.7 08/19/2023 1529   LYMPHSABS 3.4 (H) 12/19/2021 1617   MONOABS 0.3 07/01/2020 0133   EOSABS 0.1 12/19/2021 1617   BASOSABS 0.0 12/19/2021 1617    ASSESSMENT AND PLAN: 1. Prediabetes (Primary) Patient advised to eliminate sugary drinks from  the diet, cut back on portion sizes especially of white carbohydrates, eat more white lean meat like chicken [place] and seafood instead of beef or pork and incorporate fresh fruits and vegetables into the diet daily.  - POCT glycosylated hemoglobin (Hb A1C) - POCT glucose (manual entry)  2. Hyperopia of both eyes - Ambulatory referral to Ophthalmology  3. Tearing, right - Ambulatory referral to Ophthalmology  4. Need for immunization against influenza - Flu vaccine trivalent PF, 6mos and older(Flulaval,Afluria,Fluarix,Fluzone)  5. Class 1 obesity due to excess calories with serious comorbidity and body mass index (BMI) of 31.0 to 31.9 in adult See #1 above  6. Microcytic anemia Recheck CBC and iron  studies today to see whether she still needs to be on iron  supplement. - Iron , TIBC and Ferritin Panel - CBC  7.  Form completion I will fill out her FMLA form.  We agreed on intermittently for 3 to 5 days out of a month lasting from half a day to a full day.  This will allow for time off for an expected acute illnesses for herself or one of her children.  I spent 30 minutes dedicated to the care of this patient today to include previsit review of my last 2 notes, face-to-face time with patient discussing diagnosis and management and post visit completion of form.  Patient was given the opportunity to ask questions.  Patient verbalized understanding of the plan and was able to repeat key elements of the plan.   This documentation was completed using Dragon  voice Advertising copywriter.  Any transcriptional errors are unintentional.  Orders Placed This Encounter  Procedures   Flu vaccine trivalent PF, 6mos and older(Flulaval,Afluria,Fluarix,Fluzone)   Iron , TIBC and Ferritin Panel   CBC   Ambulatory referral to Ophthalmology   POCT glycosylated hemoglobin (Hb A1C)   POCT glucose (manual entry)     Requested Prescriptions    No prescriptions requested or ordered in this encounter     Return in about 6 months (around 06/11/2024).  Barnie Louder, MD, FACP

## 2023-12-14 LAB — IRON,TIBC AND FERRITIN PANEL
Ferritin: 99 ng/mL (ref 15–150)
Iron Saturation: 12 % — ABNORMAL LOW (ref 15–55)
Iron: 31 ug/dL (ref 27–159)
Total Iron Binding Capacity: 255 ug/dL (ref 250–450)
UIBC: 224 ug/dL (ref 131–425)

## 2023-12-14 LAB — CBC
Hematocrit: 38.6 % (ref 34.0–46.6)
Hemoglobin: 12 g/dL (ref 11.1–15.9)
MCH: 25.4 pg — ABNORMAL LOW (ref 26.6–33.0)
MCHC: 31.1 g/dL — ABNORMAL LOW (ref 31.5–35.7)
MCV: 82 fL (ref 79–97)
Platelets: 259 x10E3/uL (ref 150–450)
RBC: 4.73 x10E6/uL (ref 3.77–5.28)
RDW: 14.1 % (ref 11.7–15.4)
WBC: 6.4 x10E3/uL (ref 3.4–10.8)

## 2023-12-15 ENCOUNTER — Ambulatory Visit: Payer: Self-pay | Admitting: Internal Medicine

## 2024-06-11 ENCOUNTER — Ambulatory Visit: Admitting: Internal Medicine
